# Patient Record
Sex: Male | Born: 1965 | Race: White | Hispanic: No | Marital: Married | Smoking: Never smoker
Health system: Southern US, Community
[De-identification: ages and names within clinical notes are randomized; demographics above are authoritative.]

## PROBLEM LIST (undated history)

## (undated) DIAGNOSIS — M109 Gout, unspecified: Secondary | ICD-10-CM

## (undated) DIAGNOSIS — M1990 Unspecified osteoarthritis, unspecified site: Secondary | ICD-10-CM

## (undated) DIAGNOSIS — R42 Dizziness and giddiness: Secondary | ICD-10-CM

## (undated) HISTORY — PX: OTHER SURGICAL HISTORY: SHX169

## (undated) HISTORY — PX: APPENDECTOMY: SHX54

## (undated) HISTORY — DX: Gout, unspecified: M10.9

---

## 2007-03-29 ENCOUNTER — Ambulatory Visit (HOSPITAL_COMMUNITY): Admission: RE | Admit: 2007-03-29 | Discharge: 2007-03-29 | Payer: Self-pay | Admitting: Nurse Practitioner

## 2010-09-07 ENCOUNTER — Ambulatory Visit: Payer: Self-pay | Admitting: Specialist

## 2010-09-13 ENCOUNTER — Ambulatory Visit: Payer: Self-pay | Admitting: Specialist

## 2010-12-25 ENCOUNTER — Encounter: Payer: Self-pay | Admitting: Orthopaedic Surgery

## 2013-12-17 ENCOUNTER — Encounter: Payer: Self-pay | Admitting: Family Medicine

## 2013-12-17 ENCOUNTER — Ambulatory Visit (INDEPENDENT_AMBULATORY_CARE_PROVIDER_SITE_OTHER): Payer: 59 | Admitting: Family Medicine

## 2013-12-17 VITALS — BP 130/80 | HR 88 | Temp 98.7°F | Resp 18 | Ht 72.0 in | Wt 208.0 lb

## 2013-12-17 DIAGNOSIS — R52 Pain, unspecified: Secondary | ICD-10-CM

## 2013-12-17 DIAGNOSIS — J00 Acute nasopharyngitis [common cold]: Secondary | ICD-10-CM

## 2013-12-17 LAB — INFLUENZA A AND B
INFLUENZA A AG: NEGATIVE
INFLUENZA B AG: NEGATIVE

## 2013-12-17 MED ORDER — GUAIFENESIN-CODEINE 100-10 MG/5ML PO SOLN: 5.0000 mL | Freq: Four times a day (QID) | ORAL | Status: DC | PRN

## 2013-12-17 NOTE — Patient Instructions (Addendum)
Use the cough medication as prescribed Plenty fluids  Mucinex DM during the day  Ibuprofen for fever  F/U as needed

## 2013-12-17 NOTE — Progress Notes (Signed)
   Subjective:    Patient ID: Samuel Fischer, male    DOB: 05/25/1966, 48 y.o.   MRN: [id number]  HPI   patient here with cough with congestion, body aches chills and diaphoresis for the past 36 hours. He's not sure if he's had any high fever. He had been taking some Mucinex as well as ibuprofen. He is concerned about the flu therefore came in for appointment. He does not have any chronic medical problems and is not on any medications to  Review of Systems  GEN- denies fatigue+, fever, weight loss,weakness, recent illness HEENT- denies eye drainage, change in vision, +nasal discharge, CVS- denies chest pain, palpitations RESP- denies SOB,+ cough, wheeze ABD- denies N/V, change in stools, abd pain Neuro- denies headache, dizziness, syncope, seizure activity      Objective:   Physical Exam  GEN- NAD, alert and oriented x3, non toxic appearing HEENT- PERRL, EOMI, non injected sclera, pink conjunctiva, MMM, oropharynx clear, TM clear bilat no effusion, no  maxillary sinus tenderness,+  Nasal drainage  Neck- Supple, no LAD CVS- RRR, no murmur RESP-CTAB EXT- No edema Pulses- Radial 2+         Assessment & Plan:

## 2013-12-17 NOTE — Assessment & Plan Note (Signed)
Influenza is negative. I think this is more of a common cold virus. I've given him some Robitussin with codeine to take for the cough he can use Mucinex DM in the daytime as needed. Supportive care fluids and rest He was given a work note for today

## 2016-06-20 ENCOUNTER — Ambulatory Visit (INDEPENDENT_AMBULATORY_CARE_PROVIDER_SITE_OTHER): Payer: 59 | Admitting: Family Medicine

## 2016-06-20 ENCOUNTER — Encounter: Payer: Self-pay | Admitting: Family Medicine

## 2016-06-20 VITALS — BP 126/80 | Temp 97.8°F | Wt 200.0 lb

## 2016-06-20 DIAGNOSIS — M109 Gout, unspecified: Secondary | ICD-10-CM | POA: Insufficient documentation

## 2016-06-20 DIAGNOSIS — M5432 Sciatica, left side: Secondary | ICD-10-CM

## 2016-06-20 MED ORDER — COLCHICINE 0.6 MG PO TABS: ORAL_TABLET | ORAL | Status: DC

## 2016-06-20 MED ORDER — PREDNISONE 20 MG PO TABS: ORAL_TABLET | ORAL | Status: DC

## 2016-06-20 NOTE — Progress Notes (Signed)
   Subjective:    Patient ID: Samuel Fischer, male    DOB: 17-Nov-1966, 50 y.o.   MRN: [id number]  HPI Patient reports a one-week history of pain in his lower back radiating into his left gluteus down the posterior aspect of his left thigh into his left leg. He believes he is having sciatica. Symptoms began after he aggravated his back lifting weights at work. He apparently was moving a parking barrier so that he parked his truck and felt a pull in his back. The pain had been going on slightly before that but afterwards it intensified. He is here today for further evaluation. He denies any saddle anesthesia, bowel or bladder incontinence, or leg weakness. He does have a positive straight leg raise on the left side. He has normal reflexes and normal strength in both legs No past medical history on file. No past surgical history on file. No current outpatient prescriptions on file prior to visit.   No current facility-administered medications on file prior to visit.   No Known Allergies Social History   Social History  . Marital Status: Single    Spouse Name: N/A  . Number of Children: N/A  . Years of Education: N/A   Occupational History  . Not on file.   Social History Main Topics  . Smoking status: Never Smoker   . Smokeless tobacco: Not on file  . Alcohol Use: Not on file  . Drug Use: Not on file  . Sexual Activity: Not on file   Other Topics Concern  . Not on file   Social History Narrative      Review of Systems  All other systems reviewed and are negative.      Objective:   Physical Exam  Cardiovascular: Normal rate, regular rhythm and normal heart sounds.   Pulmonary/Chest: Effort normal and breath sounds normal. No respiratory distress. He has no wheezes. He has no rales.  Musculoskeletal:       Lumbar back: He exhibits decreased range of motion, tenderness, bony tenderness and pain. He exhibits no spasm.  Vitals reviewed.         Assessment & Plan:  Left  sided sciatica - Plan: predniSONE (DELTASONE) 20 MG tablet  Begin prednisone taper pack for sciatica. Recheck in 2 weeks if no better or sooner if worse.  Also gave the patient 30 tablets of colchicine that he can use as needed for gout exacerbations. He has them very infrequently but would like to have some on hand just in case he needs them

## 2018-12-12 DIAGNOSIS — M722 Plantar fascial fibromatosis: Secondary | ICD-10-CM | POA: Insufficient documentation

## 2019-02-05 ENCOUNTER — Encounter: Payer: Self-pay | Admitting: Podiatry

## 2019-02-05 ENCOUNTER — Ambulatory Visit (INDEPENDENT_AMBULATORY_CARE_PROVIDER_SITE_OTHER): Payer: 59

## 2019-02-05 ENCOUNTER — Ambulatory Visit (INDEPENDENT_AMBULATORY_CARE_PROVIDER_SITE_OTHER): Payer: 59 | Admitting: Podiatry

## 2019-02-05 VITALS — BP 133/89 | HR 70 | Resp 16

## 2019-02-05 DIAGNOSIS — M722 Plantar fascial fibromatosis: Secondary | ICD-10-CM

## 2019-02-05 DIAGNOSIS — M1710 Unilateral primary osteoarthritis, unspecified knee: Secondary | ICD-10-CM | POA: Insufficient documentation

## 2019-02-05 MED ORDER — METHYLPREDNISOLONE 4 MG PO TBPK: ORAL_TABLET | ORAL | 0 refills | Status: DC

## 2019-02-05 MED ORDER — MELOXICAM 15 MG PO TABS: 15.0000 mg | ORAL_TABLET | Freq: Every day | ORAL | 3 refills | Status: DC

## 2019-02-05 NOTE — Patient Instructions (Signed)

## 2019-02-05 NOTE — Progress Notes (Signed)
  Subjective:  Patient ID: Samuel Fischer, male    DOB: February 04, 1966,  MRN: [id number] HPI Chief Complaint  Patient presents with  . Foot Pain    Plantar heel and arch left - aching x 3 months, AM pain and worse at end of day after work, saw doc-gave shot, said to ice, rx'd meloxicam-still hurts  . New Patient (Initial Visit)    53 y.o. male presents with the above complaint.   ROS: Denies fever chills nausea vomiting muscle aches pains calf pain back pain chest pain shortness of breath.  Past Medical History:  Diagnosis Date  . Gout    No past surgical history on file.  Current Outpatient Medications:  .  meloxicam (MOBIC) 15 MG tablet, , Disp: , Rfl:  .  meloxicam (MOBIC) 15 MG tablet, Take 1 tablet (15 mg total) by mouth daily., Disp: 30 tablet, Rfl: 3 .  methylPREDNISolone (MEDROL DOSEPAK) 4 MG TBPK tablet, 6 day dose pack - take as directed, Disp: 21 tablet, Rfl: 0  No Known Allergies Review of Systems Objective:   Vitals:   02/05/19 1453  BP: 133/89  Pulse: 70  Resp: 16    General: Well developed, nourished, in no acute distress, alert and oriented x3   Dermatological: Skin is warm, dry and supple bilateral. Nails x 10 are well maintained; remaining integument appears unremarkable at this time. There are no open sores, no preulcerative lesions, no rash or signs of infection present.  Vascular: Dorsalis Pedis artery and Posterior Tibial artery pedal pulses are 2/4 bilateral with immedate capillary fill time. Pedal hair growth present. No varicosities and no lower extremity edema present bilateral.   Neruologic: Grossly intact via light touch bilateral. Vibratory intact via tuning fork bilateral. Protective threshold with Semmes Wienstein monofilament intact to all pedal sites bilateral. Patellar and Achilles deep tendon reflexes 2+ bilateral. No Babinski or clonus noted bilateral.   Musculoskeletal: No gross boney pedal deformities bilateral. No pain, crepitus, or  limitation noted with foot and ankle range of motion bilateral. Muscular strength 5/5 in all groups tested bilateral.  Pain on palpation medial Cokato tubercle of the left heel.  Gait: Unassisted, Nonantalgic.    Radiographs:  Radiographs taken today demonstrate plantar distally oriented calcaneal heel spur and soft tissue increase in density plantar fashion calcaneal insertion site left heel.  Assessment & Plan:   Assessment: Plantar fasciitis left heel.  Plan: Discussed etiology pathology conservative versus surgical therapies.  At this point I injected his left heel with 20 mg Kenalog 5 mg Marcaine point maximal tenderness left heel.  Tolerated procedure well without complications.  Also put him on a Medrol Dosepak to be followed by meloxicam.  Placed him in a plantar fascial brace.  If he is no better we will consider orthotics and night splint next visit.     Ipek Westra T. Lockhart, [place]

## 2019-03-05 ENCOUNTER — Ambulatory Visit: Payer: 59 | Admitting: Podiatry

## 2019-10-08 ENCOUNTER — Ambulatory Visit (INDEPENDENT_AMBULATORY_CARE_PROVIDER_SITE_OTHER): Payer: 59 | Admitting: Podiatry

## 2019-10-08 ENCOUNTER — Encounter: Payer: Self-pay | Admitting: Podiatry

## 2019-10-08 ENCOUNTER — Other Ambulatory Visit: Payer: Self-pay

## 2019-10-08 ENCOUNTER — Encounter

## 2019-10-08 DIAGNOSIS — M722 Plantar fascial fibromatosis: Secondary | ICD-10-CM | POA: Diagnosis not present

## 2019-10-08 MED ORDER — MELOXICAM 15 MG PO TABS: 15.0000 mg | ORAL_TABLET | Freq: Every day | ORAL | 3 refills | Status: DC

## 2019-10-08 MED ORDER — METHYLPREDNISOLONE 4 MG PO TBPK: ORAL_TABLET | ORAL | 0 refills | Status: DC

## 2019-10-08 NOTE — Progress Notes (Signed)
She presents today for follow-up of her plantar fasciitis and orthotics.  She states that it started hurting again about 2 weeks ago  Objective: Vital signs are stable alert oriented x3.  She has pain to palpation medial calcaneal tubercle of the left heel.  Assessment: Plantar fasciitis left.  Plan: Injected left heel today 20 mg Kenalog 5 mg Marcaine follow-up with me as needed.

## 2019-11-05 ENCOUNTER — Ambulatory Visit: Payer: 59 | Admitting: Orthotics

## 2019-11-05 ENCOUNTER — Other Ambulatory Visit: Payer: Self-pay

## 2019-11-05 DIAGNOSIS — M722 Plantar fascial fibromatosis: Secondary | ICD-10-CM

## 2019-11-05 NOTE — Progress Notes (Signed)
Patient came in today to pick up custom made foot orthotics.  The goals were accomplished and the patient reported no dissatisfaction with said orthotics.  Patient was advised of breakin period and how to report any issues. 

## 2020-08-31 ENCOUNTER — Ambulatory Visit (INDEPENDENT_AMBULATORY_CARE_PROVIDER_SITE_OTHER): Payer: 59 | Admitting: Podiatry

## 2020-08-31 ENCOUNTER — Ambulatory Visit (INDEPENDENT_AMBULATORY_CARE_PROVIDER_SITE_OTHER): Payer: 59

## 2020-08-31 ENCOUNTER — Encounter: Payer: Self-pay | Admitting: Podiatry

## 2020-08-31 ENCOUNTER — Other Ambulatory Visit: Payer: Self-pay

## 2020-08-31 DIAGNOSIS — M10072 Idiopathic gout, left ankle and foot: Secondary | ICD-10-CM | POA: Diagnosis not present

## 2020-08-31 DIAGNOSIS — M7752 Other enthesopathy of left foot: Secondary | ICD-10-CM

## 2020-08-31 MED ORDER — COLCHICINE 0.6 MG PO TABS: 0.6000 mg | ORAL_TABLET | Freq: Two times a day (BID) | ORAL | 1 refills | Status: DC

## 2020-08-31 MED ORDER — ALLOPURINOL 100 MG PO TABS: 100.0000 mg | ORAL_TABLET | Freq: Every day | ORAL | 3 refills | Status: DC

## 2020-09-01 ENCOUNTER — Encounter: Payer: Self-pay | Admitting: Podiatry

## 2020-09-01 NOTE — Progress Notes (Signed)
Subjective:  Patient ID: Samuel Fischer, male    DOB: 09-22-1966,  MRN: [id number]  Chief Complaint  Patient presents with  . Foot Pain    Patient presents today for possible gout flareup right hallux and injury to same toe about 1 week go.  He states "i dropped a bushhog on my foot about 1 week ago, I had steel toed boots on, but the same time I think my gout was flaring up"    54 y.o. male presents with the above complaint.  Patient presents with a possible gout flareup to the left hallux.  Patient states this started at the same time when he had an injury where ago Chaga was dropped on the foot.  Patient does not know if his injury or the gout flare.  Patient does have a history of gout and has been treated in the past for it.  Patient does have some allopurinol and ice which has not helped.  He would like to discuss treatment options.  His diet is significant for red meat and alcohol consumption.  I encouraged decreasing that to help with decreasing the total purine count.  Patient states understanding  Review of Systems: Negative except as noted in the HPI. Denies N/V/F/Ch.  Past Medical History:  Diagnosis Date  . Gout     Current Outpatient Medications:  .  allopurinol (ZYLOPRIM) 100 MG tablet, Take 1 tablet (100 mg total) by mouth daily., Disp: 30 tablet, Rfl: 3 .  colchicine 0.6 MG tablet, Take 1 tablet (0.6 mg total) by mouth 2 (two) times daily., Disp: 30 tablet, Rfl: 1  Social History   Tobacco Use  Smoking Status Never Smoker  Smokeless Tobacco Never Used    No Known Allergies Objective:  There were no vitals filed for this visit. There is no height or weight on file to calculate BMI. Constitutional Well developed. Well nourished.  Vascular Dorsalis pedis pulses palpable bilaterally. Posterior tibial pulses palpable bilaterally. Capillary refill normal to all digits.  No cyanosis or clubbing noted. Pedal hair growth normal.  Neurologic Normal speech. Oriented to  person, place, and time. Epicritic sensation to light touch grossly present bilaterally.  Dermatologic Nails well groomed and normal in appearance. No open wounds. No skin lesions.  Orthopedic:  Pain on palpation to the left first MPJ.  Pain with range of motion of the first MPJ.  Mild crepitus noted.  No intra-articular pain.  Red hot swollen joint noted.   Radiographs: 3 views of skeletally mature the left foot: Mild extra-articular changes noted consistent with gout.  No signs of arthritic changes or intra-articular changes noted.  No other bony abnormalities noted. Assessment:   1. Acute idiopathic gout involving toe of left foot   2. Capsulitis of metatarsophalangeal (MTP) joint of left foot    Plan:  Patient was evaluated and treated and all questions answered.  Left first MPJ gout flare -I explained to the patient the etiology of gout flare and various treatment options were discussed.  Given the patient has a poor diet control in terms of red meat and alcohol consumption I encouraged better diet control.  Patient states understanding and he will work on that.  At this time given the amount of pain that is having I believe will benefit from a steroid injection.  Patient agrees with the plan would like to proceed with a steroid injection. -A steroid injection was performed at left first MPJ joint using 1% plain Lidocaine and 10 mg of Kenalog.  This was well tolerated. -I encouraged the patient to follow-up with a rheumatologist for further management and evaluation.  I will place him on allopurinol and dispensed colchicine to help with acute pain at this time.  No follow-ups on file.

## 2020-10-19 ENCOUNTER — Telehealth: Payer: Self-pay | Admitting: *Deleted

## 2020-10-19 NOTE — Telephone Encounter (Signed)
"  Dr. Posey Pronto gave me a prescription for Allupurinol when I was last there.  I need a refill.  I have refills but my insurance only allows me to get 90 day supplies.  Is it possible to have that prescription changed to a 90 supply?"  I'll send your message to the nurse and she'll call you back.

## 2020-10-20 NOTE — Telephone Encounter (Signed)
"  I called yesterday and requested a 90 day prescription for Allupurinol.  I never received a call back."  Dr. Posey Pronto said if you need a Filippini term prescription for the Allupurinol, you must ask your Rheumatologist.  "Okay, I'll do that.  It will probably take a month to get an appointment with the Rheumatologist.  Can he call me in a prescription for another refill to last me until I see my doctor?"  You have 3 refills on your prescription.  Call your pharmacy and let them know you need a refill.  "Okay, I don't know how all this stuff works.  Thanks for your help."

## 2020-10-20 NOTE — Telephone Encounter (Signed)
If the insurance only allows 90-day at a time that unfortunately she would just have to go in every 90 days.  She can also see a rheumatologist who can prescribe her for more Potenza-term management.

## 2021-04-11 ENCOUNTER — Other Ambulatory Visit: Payer: Self-pay | Admitting: Podiatry

## 2021-04-11 NOTE — Telephone Encounter (Signed)
Please Advise

## 2021-04-20 ENCOUNTER — Other Ambulatory Visit: Payer: Self-pay

## 2021-04-20 ENCOUNTER — Ambulatory Visit (INDEPENDENT_AMBULATORY_CARE_PROVIDER_SITE_OTHER): Payer: 59 | Admitting: Internal Medicine

## 2021-04-20 ENCOUNTER — Encounter: Payer: Self-pay | Admitting: Internal Medicine

## 2021-04-20 VITALS — BP 121/71 | HR 64 | Temp 98.7°F | Resp 18 | Ht 72.0 in | Wt 209.0 lb

## 2021-04-20 DIAGNOSIS — Z7689 Persons encountering health services in other specified circumstances: Secondary | ICD-10-CM | POA: Diagnosis not present

## 2021-04-20 DIAGNOSIS — Z114 Encounter for screening for human immunodeficiency virus [HIV]: Secondary | ICD-10-CM | POA: Diagnosis not present

## 2021-04-20 DIAGNOSIS — Z1159 Encounter for screening for other viral diseases: Secondary | ICD-10-CM

## 2021-04-20 DIAGNOSIS — M1A9XX0 Chronic gout, unspecified, without tophus (tophi): Secondary | ICD-10-CM | POA: Diagnosis not present

## 2021-04-20 DIAGNOSIS — Z5320 Procedure and treatment not carried out because of patient's decision for unspecified reasons: Secondary | ICD-10-CM

## 2021-04-20 MED ORDER — ALLOPURINOL 100 MG PO TABS: 100.0000 mg | ORAL_TABLET | Freq: Every day | ORAL | 5 refills | Status: DC

## 2021-04-20 NOTE — Progress Notes (Signed)
New Patient Office Visit  Subjective:  Patient ID: Samuel Fischer, male    DOB: 06/08/1966  Age: 55 y.o. MRN: [id number]  CC:  Chief Complaint  Patient presents with  . New Patient (Initial Visit)    New patient has gout flare ups in feet all the time needs medications for this.    HPI Samuel Fischer is a 55 year old male with PMH of gout who presents for establishing care.  He has been doing well overall. He was on Allopurinol for gout in the past, but he ran out of it due to lack of follow up with PCP. He has had about 3 gout flare-ups in last 1 year. He denies any foot pain currently. He also used to follow up with Podiatrist for plantar fasciitis.  He has not had any colonoscopy yet.  He has had 2 doses of COVID vaccine.  Past Medical History:  Diagnosis Date  . Gout     History reviewed. No pertinent surgical history.  History reviewed. No pertinent family history.  Social History   Socioeconomic History  . Marital status: Single    Spouse name: Not on file  . Number of children: Not on file  . Years of education: Not on file  . Highest education level: Not on file  Occupational History  . Not on file  Tobacco Use  . Smoking status: Never Smoker  . Smokeless tobacco: Never Used  Substance and Sexual Activity  . Alcohol use: Not on file  . Drug use: Not on file  . Sexual activity: Not on file  Other Topics Concern  . Not on file  Social History Narrative  . Not on file   Social Determinants of Health   Financial Resource Strain: Not on file  Food Insecurity: Not on file  Transportation Needs: Not on file  Physical Activity: Not on file  Stress: Not on file  Social Connections: Not on file  Intimate Partner Violence: Not on file    ROS Review of Systems  Constitutional: Negative for chills and fever.  HENT: Negative for congestion and sore throat.   Eyes: Negative for pain and discharge.  Respiratory: Negative for cough and shortness of breath.    Cardiovascular: Negative for chest pain and palpitations.  Gastrointestinal: Negative for constipation, diarrhea, nausea and vomiting.  Endocrine: Negative for polydipsia and polyuria.  Genitourinary: Negative for dysuria and hematuria.  Musculoskeletal: Negative for neck pain and neck stiffness.  Skin: Negative for rash.  Neurological: Negative for dizziness, weakness, numbness and headaches.  Psychiatric/Behavioral: Negative for agitation and behavioral problems.    Objective:   Today's Vitals: BP 121/71 (BP Location: Left Arm, Patient Position: Sitting, Cuff Size: Normal)   Pulse 64   Temp 98.7 F (37.1 C) (Oral)   Resp 18   Ht 6' (1.829 m)   Wt 209 lb (94.8 kg)   SpO2 97%   BMI 28.35 kg/m   Physical Exam Vitals reviewed.  Constitutional:      General: He is not in acute distress.    Appearance: He is not diaphoretic.  HENT:     Head: Normocephalic and atraumatic.     Nose: Nose normal.     Mouth/Throat:     Mouth: Mucous membranes are moist.  Eyes:     General: No scleral icterus.    Extraocular Movements: Extraocular movements intact.  Cardiovascular:     Rate and Rhythm: Normal rate and regular rhythm.     Pulses: Normal pulses.  Heart sounds: Normal heart sounds. No murmur heard.   Pulmonary:     Breath sounds: Normal breath sounds. No wheezing or rales.  Musculoskeletal:     Cervical back: Neck supple. No tenderness.     Right lower leg: No edema.     Left lower leg: No edema.  Skin:    General: Skin is warm.     Findings: No rash.  Neurological:     General: No focal deficit present.     Mental Status: He is alert and oriented to person, place, and time.  Psychiatric:        Mood and Affect: Mood normal.        Behavior: Behavior normal.     Assessment & Plan:   Problem List Items Addressed This Visit      Other   Gout    Chronic gout Was on Allopurinol, ran out of it Had about 3 gout flares last year Refilled Allopurinol Check uric  acid level      Relevant Medications   allopurinol (ZYLOPRIM) 100 MG tablet   Other Relevant Orders   Uric acid   Encounter to establish care - Primary    Care established Previous chart reviewed History and medications reviewed with the patient      Relevant Orders   CBC with Differential/Platelet   CMP14+EGFR   Lipid panel   HgB A1c   Colonoscopy refused    Reports colon cancer in family (Uncle) in his 9s Denies colonoscopy for now Emphasized importance in his case, he states he would think about it.       Other Visit Diagnoses    Need for hepatitis C screening test       Relevant Orders   Hepatitis C Antibody   Encounter for screening for HIV       Relevant Orders   HIV antibody (with reflex)      Outpatient Encounter Medications as of 04/20/2021  Medication Sig  . allopurinol (ZYLOPRIM) 100 MG tablet Take 1 tablet (100 mg total) by mouth daily.  . colchicine 0.6 MG tablet Take 1 tablet by mouth twice daily (Patient not taking: Reported on 04/20/2021)  . [DISCONTINUED] allopurinol (ZYLOPRIM) 100 MG tablet Take 1 tablet (100 mg total) by mouth daily. (Patient not taking: Reported on 04/20/2021)   No facility-administered encounter medications on file as of 04/20/2021.    Follow-up: Return in about 6 months (around 10/21/2021) for Annual physical.   Samuel Spar, MD

## 2021-04-20 NOTE — Assessment & Plan Note (Signed)
Reports colon cancer in family (Uncle) in his 63s Denies colonoscopy for now Emphasized importance in his case, he states he would think about it.

## 2021-04-20 NOTE — Patient Instructions (Signed)
Please continue taking Allopurinol for gout prophylaxis.  Please avoid alcohol and high-protein food.  Please get fasting blood tests done before the next visit.  Please bring the blood test reports from your workplace if possible.

## 2021-04-20 NOTE — Assessment & Plan Note (Addendum)
Chronic gout Was on Allopurinol, ran out of it Had about 3 gout flares last year Refilled Allopurinol Check uric acid level

## 2021-04-20 NOTE — Assessment & Plan Note (Signed)
Care established Previous chart reviewed History and medications reviewed with the patient 

## 2021-10-20 ENCOUNTER — Other Ambulatory Visit: Payer: Self-pay

## 2021-10-20 ENCOUNTER — Ambulatory Visit (INDEPENDENT_AMBULATORY_CARE_PROVIDER_SITE_OTHER): Payer: 59 | Admitting: Internal Medicine

## 2021-10-20 ENCOUNTER — Encounter: Payer: Self-pay | Admitting: Internal Medicine

## 2021-10-20 VITALS — BP 138/78 | HR 71 | Resp 18 | Ht 72.0 in | Wt 213.1 lb

## 2021-10-20 DIAGNOSIS — M1710 Unilateral primary osteoarthritis, unspecified knee: Secondary | ICD-10-CM

## 2021-10-20 DIAGNOSIS — Z1211 Encounter for screening for malignant neoplasm of colon: Secondary | ICD-10-CM

## 2021-10-20 DIAGNOSIS — Z0001 Encounter for general adult medical examination with abnormal findings: Secondary | ICD-10-CM | POA: Insufficient documentation

## 2021-10-20 DIAGNOSIS — M1A9XX0 Chronic gout, unspecified, without tophus (tophi): Secondary | ICD-10-CM

## 2021-10-20 MED ORDER — ALLOPURINOL 100 MG PO TABS: 100.0000 mg | ORAL_TABLET | Freq: Every day | ORAL | 1 refills | Status: DC

## 2021-10-20 NOTE — Patient Instructions (Addendum)
Please continue taking medications as prescribed.  Please take Tylenol arthritis for knee pain. Okay to use Voltaren gel for knee arthritis.

## 2021-10-20 NOTE — Assessment & Plan Note (Signed)
Reports colon cancer in family (Uncle) in his 59s Emphasized importance in his case, he agrees.

## 2021-10-20 NOTE — Assessment & Plan Note (Signed)
Right knee pain with swelling Has had arthroscopy in the past Could be ligament injury in addition to arthritis, offered Orthopedic surgery referral, he prefers to wait for now. Tylenol arthritis PRN Voltaren gel PRN

## 2021-10-20 NOTE — Assessment & Plan Note (Signed)
Chronic gout Had about 3 gout flares last year Refilled Allopurinol Check uric acid level 

## 2021-10-20 NOTE — Progress Notes (Signed)
Established Patient Office Visit  Subjective:  Patient ID: Samuel Fischer, male    DOB: 04-03-1966  Age: 55 y.o. MRN: [id number]  CC:  Chief Complaint  Patient presents with   Annual Exam    Annual exam     HPI DVID PENDRY is a 55 y.o. male with past medical history of gout who presents for annual physical.  He has been doing well overall. No episodes of gout since starting Allopurinol.  He c/o chronic right knee pain. He has had arthroscopy in the past, but does not have any Orthopedic surgeon to follow up currently. He has been taking BC powder for pain. 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  He agrees to get screening colonoscopy done.  He denies flu and shingrix vaccine.  Past Medical History:  Diagnosis Date   Gout     History reviewed. No pertinent surgical history.  History reviewed. No pertinent family history.  Social History   Socioeconomic History   Marital status: Single    Spouse name: Not on file   Number of children: Not on file   Years of education: Not on file   Highest education level: Not on file  Occupational History   Not on file  Tobacco Use   Smoking status: Never   Smokeless tobacco: Never  Substance and Sexual Activity   Alcohol use: Not on file   Drug use: Not on file   Sexual activity: Not on file  Other Topics Concern   Not on file  Social History Narrative   Not on file   Social Determinants of Health   Financial Resource Strain: Not on file  Food Insecurity: Not on file  Transportation Needs: Not on file  Physical Activity: Not on file  Stress: Not on file  Social Connections: Not on file  Intimate Partner Violence: Not on file    Outpatient Medications Prior to Visit  Medication Sig Dispense Refill   allopurinol (ZYLOPRIM) 100 MG tablet Take 1 tablet (100 mg total) by mouth daily. 30 tablet 5   colchicine 0.6 MG tablet Take 1 tablet by mouth twice daily (Patient not taking: Reported on 04/20/2021) 30  tablet 0   No facility-administered medications prior to visit.    No Known Allergies  ROS Review of Systems  Constitutional:  Negative for chills and fever.  HENT:  Negative for congestion and sore throat.   Eyes:  Negative for pain and discharge.  Respiratory:  Negative for cough and shortness of breath.   Cardiovascular:  Negative for chest pain and palpitations.  Gastrointestinal:  Negative for constipation, diarrhea, nausea and vomiting.  Endocrine: Negative for polydipsia and polyuria.  Genitourinary:  Negative for dysuria and hematuria.  Musculoskeletal:  Positive for arthralgias (R knee). Negative for neck pain and neck stiffness.  Skin:  Negative for rash.  Neurological:  Negative for dizziness, weakness, numbness and headaches.  Psychiatric/Behavioral:  Negative for agitation and behavioral problems.      Objective:    Physical Exam Vitals reviewed.  Constitutional:      General: He is not in acute distress.    Appearance: He is not diaphoretic.  HENT:     Head: Normocephalic and atraumatic.     Nose: Nose normal.     Mouth/Throat:     Mouth: Mucous membranes are moist.  Eyes:     General: No scleral icterus.    Extraocular Movements: Extraocular movements intact.  Cardiovascular:     Rate  and Rhythm: Normal rate and regular rhythm.     Pulses: Normal pulses.     Heart sounds: Normal heart sounds. No murmur heard. Pulmonary:     Breath sounds: Normal breath sounds. No wheezing or rales.  Abdominal:     Palpations: Abdomen is soft.     Tenderness: There is no abdominal tenderness.  Musculoskeletal:        General: Tenderness (Medial aspect of right knee with mild swelling, no erythema) present.     Cervical back: Neck supple. No tenderness.     Right lower leg: No edema.     Left lower leg: No edema.  Skin:    General: Skin is warm.     Findings: No rash.  Neurological:     General: No focal deficit present.     Mental Status: He is alert and oriented  to person, place, and time.     Cranial Nerves: No cranial nerve deficit.     Sensory: No sensory deficit.     Motor: No weakness.  Psychiatric:        Mood and Affect: Mood normal.        Behavior: Behavior normal.    BP 138/78 (BP Location: Left Arm, Patient Position: Sitting, Cuff Size: Normal)   Pulse 71   Resp 18   Ht 6' (1.829 m)   Wt 213 lb 1.3 oz (96.7 kg)   SpO2 98%   BMI 28.90 kg/m  Wt Readings from Last 3 Encounters:  10/20/21 213 lb 1.3 oz (96.7 kg)  04/20/21 209 lb (94.8 kg)  06/20/16 200 lb (90.7 kg)    No results found for: TSH No results found for: WBC, HGB, HCT, MCV, PLT No results found for: NA, K, CHLORIDE, CO2, GLUCOSE, BUN, CREATININE, BILITOT, ALKPHOS, AST, ALT, PROT, ALBUMIN, CALCIUM, ANIONGAP, EGFR, GFR No results found for: CHOL No results found for: HDL No results found for: LDLCALC No results found for: TRIG No results found for: CHOLHDL No results found for: HGBA1C    Assessment & Plan:   Problem List Items Addressed This Visit       Encounter for general adult medical examination with abnormal findings - Primary   Physical exam as documented. Counseling done  re healthy lifestyle involving commitment to 150 minutes exercise per week, heart healthy diet, and attaining healthy weight.The importance of adequate sleep also discussed. Changes in health habits are decided on by the patient with goals and time frames  set for achieving them. Immunization and cancer screening needs are specifically addressed at this visit.  Denies flu and Shingrix vaccine.       Musculoskeletal and Integument   Arthritis of knee    Right knee pain with swelling Has had arthroscopy in the past Could be ligament injury in addition to arthritis, offered Orthopedic surgery referral, he prefers to wait for now. Tylenol arthritis PRN Voltaren gel PRN      Relevant Medications   allopurinol (ZYLOPRIM) 100 MG tablet     Other   Gout    Chronic gout Had  about 3 gout flares last year Refilled Allopurinol Check uric acid level      Relevant Medications   allopurinol (ZYLOPRIM) 100 MG tablet         Screening for colon cancer    Reports colon cancer in family (Uncle) in his 14s Emphasized importance in his case, he agrees.      Relevant Orders   Ambulatory referral to Gastroenterology  Meds ordered this encounter  Medications   allopurinol (ZYLOPRIM) 100 MG tablet    Sig: Take 1 tablet (100 mg total) by mouth daily.    Dispense:  90 tablet    Refill:  1    Follow-up: Return in 6 months (on 04/19/2022).    Lindell Spar, MD

## 2021-10-20 NOTE — Assessment & Plan Note (Signed)

## 2021-10-21 LAB — LIPID PANEL
Chol/HDL Ratio: 4.1 ratio (ref 0.0–5.0)
Cholesterol, Total: 195 mg/dL (ref 100–199)
HDL: 47 mg/dL (ref >39)
LDL Chol Calc (NIH): 108 mg/dL — ABNORMAL HIGH (ref 0–99)
Triglycerides: 233 mg/dL — ABNORMAL HIGH (ref 0–149)
VLDL Cholesterol Cal: 40 mg/dL (ref 5–40)

## 2021-10-21 LAB — CMP14+EGFR
ALT: 26 IU/L (ref 0–44)
AST: 24 IU/L (ref 0–40)
Albumin/Globulin Ratio: 1.7 (ref 1.2–2.2)
Albumin: 4.3 g/dL (ref 3.8–4.9)
Alkaline Phosphatase: 99 IU/L (ref 44–121)
BUN/Creatinine Ratio: 14 (ref 9–20)
BUN: 15 mg/dL (ref 6–24)
Bilirubin Total: 0.3 mg/dL (ref 0.0–1.2)
CO2: 24 mmol/L (ref 20–29)
Calcium: 9.6 mg/dL (ref 8.7–10.2)
Chloride: 103 mmol/L (ref 96–106)
Creatinine, Ser: 1.07 mg/dL (ref 0.76–1.27)
Globulin, Total: 2.5 g/dL (ref 1.5–4.5)
Glucose: 100 mg/dL — ABNORMAL HIGH (ref 70–99)
Potassium: 4.8 mmol/L (ref 3.5–5.2)
Sodium: 142 mmol/L (ref 134–144)
Total Protein: 6.8 g/dL (ref 6.0–8.5)
eGFR: 83 mL/min/{1.73_m2} (ref >59)

## 2021-10-21 LAB — CBC WITH DIFFERENTIAL/PLATELET
Basophils Absolute: 0.1 10*3/uL (ref 0.0–0.2)
Basos: 1 %
EOS (ABSOLUTE): 0.2 10*3/uL (ref 0.0–0.4)
Eos: 3 %
Hematocrit: 41.6 % (ref 37.5–51.0)
Hemoglobin: 14.5 g/dL (ref 13.0–17.7)
Immature Grans (Abs): 0 10*3/uL (ref 0.0–0.1)
Immature Granulocytes: 0 %
Lymphocytes Absolute: 1.8 10*3/uL (ref 0.7–3.1)
Lymphs: 28 %
MCH: 31.7 pg (ref 26.6–33.0)
MCHC: 34.9 g/dL (ref 31.5–35.7)
MCV: 91 fL (ref 79–97)
Monocytes Absolute: 0.6 10*3/uL (ref 0.1–0.9)
Monocytes: 10 %
Neutrophils Absolute: 3.6 10*3/uL (ref 1.4–7.0)
Neutrophils: 58 %
Platelets: 246 10*3/uL (ref 150–450)
RBC: 4.58 x10E6/uL (ref 4.14–5.80)
RDW: 12.5 % (ref 11.6–15.4)
WBC: 6.3 10*3/uL (ref 3.4–10.8)

## 2021-10-21 LAB — HEMOGLOBIN A1C
Est. average glucose Bld gHb Est-mCnc: 105 mg/dL
Hgb A1c MFr Bld: 5.3 % (ref 4.8–5.6)

## 2021-10-21 LAB — HIV ANTIBODY (ROUTINE TESTING W REFLEX): HIV Screen 4th Generation wRfx: NONREACTIVE

## 2021-10-21 LAB — URIC ACID: Uric Acid: 6.6 mg/dL (ref 3.8–8.4)

## 2021-10-21 LAB — HEPATITIS C ANTIBODY: Hep C Virus Ab: 0.2 s/co ratio (ref 0.0–0.9)

## 2021-10-24 ENCOUNTER — Encounter (INDEPENDENT_AMBULATORY_CARE_PROVIDER_SITE_OTHER): Payer: Self-pay | Admitting: *Deleted

## 2021-11-09 ENCOUNTER — Other Ambulatory Visit (INDEPENDENT_AMBULATORY_CARE_PROVIDER_SITE_OTHER): Payer: Self-pay

## 2021-11-09 DIAGNOSIS — Z1211 Encounter for screening for malignant neoplasm of colon: Secondary | ICD-10-CM

## 2021-11-14 ENCOUNTER — Telehealth (INDEPENDENT_AMBULATORY_CARE_PROVIDER_SITE_OTHER): Payer: Self-pay

## 2021-11-14 ENCOUNTER — Encounter (INDEPENDENT_AMBULATORY_CARE_PROVIDER_SITE_OTHER): Payer: Self-pay

## 2021-11-14 ENCOUNTER — Other Ambulatory Visit (INDEPENDENT_AMBULATORY_CARE_PROVIDER_SITE_OTHER): Payer: Self-pay

## 2021-11-14 MED ORDER — PEG 3350-KCL-NA BICARB-NACL 420 G PO SOLR: 4000.0000 mL | ORAL | 0 refills | Status: DC

## 2021-11-14 NOTE — Telephone Encounter (Signed)
Referring MD/PCP: Posey Pronto  Procedure: Tcs  Reason/Indication:  Screening , fam hx of colon ca  Has patient had this procedure before?  no  If so, when, by whom and where?    Is there a family history of colon cancer?  Yes   Who?  What age when diagnosed?  Uncle  Is patient diabetic? If yes, Type 1 or Type 2   no      Does patient have prosthetic heart valve or mechanical valve?  no  Do you have a pacemaker/defibrillator?  no  Has patient ever had endocarditis/atrial fibrillation? no  Does patient use oxygen? no  Has patient had joint replacement within last 12 months?  no  Is patient constipated or do they take laxatives? no  Does patient have a history of alcohol/drug use?  no  Have you had a stroke/heart attack last 6 mths? no  Do you take medicine for weight loss?  no  For male patients,: do you still have your menstrual cycle? no  Is patient on blood thinner such as Coumadin, Plavix and/or Aspirin? no  Medications: allopurinol 100 mg daily  Allergies: nkda  Medication Adjustment per Dr Jenetta Downer none  Procedure date & time: Wednesday 12/14/21 at 9:00

## 2021-11-14 NOTE — Telephone Encounter (Signed)
LeighAnn Roberto Hlavaty, CMA  

## 2021-11-21 ENCOUNTER — Encounter (HOSPITAL_COMMUNITY): Payer: Self-pay | Admitting: *Deleted

## 2021-11-21 ENCOUNTER — Emergency Department (HOSPITAL_COMMUNITY)
Admission: EM | Admit: 2021-11-21 | Discharge: 2021-11-21 | Disposition: A | Discharge status: Home / Self Care | Payer: 59 | Attending: Emergency Medicine | Admitting: Emergency Medicine

## 2021-11-21 ENCOUNTER — Emergency Department (HOSPITAL_COMMUNITY): Payer: 59

## 2021-11-21 ENCOUNTER — Other Ambulatory Visit: Payer: Self-pay

## 2021-11-21 DIAGNOSIS — M542 Cervicalgia: Secondary | ICD-10-CM | POA: Insufficient documentation

## 2021-11-21 DIAGNOSIS — H81399 Other peripheral vertigo, unspecified ear: Secondary | ICD-10-CM

## 2021-11-21 DIAGNOSIS — R112 Nausea with vomiting, unspecified: Secondary | ICD-10-CM | POA: Insufficient documentation

## 2021-11-21 DIAGNOSIS — R911 Solitary pulmonary nodule: Secondary | ICD-10-CM | POA: Diagnosis not present

## 2021-11-21 DIAGNOSIS — R42 Dizziness and giddiness: Secondary | ICD-10-CM | POA: Diagnosis not present

## 2021-11-21 HISTORY — DX: Dizziness and giddiness: R42

## 2021-11-21 HISTORY — DX: Unspecified osteoarthritis, unspecified site: M19.90

## 2021-11-21 LAB — TROPONIN I (HIGH SENSITIVITY)
Troponin I (High Sensitivity): 3 ng/L (ref <18)
Troponin I (High Sensitivity): 4 ng/L (ref <18)

## 2021-11-21 LAB — CBC
HCT: 42.5 % (ref 39.0–52.0)
Hemoglobin: 14.4 g/dL (ref 13.0–17.0)
MCH: 31.7 pg (ref 26.0–34.0)
MCHC: 33.9 g/dL (ref 30.0–36.0)
MCV: 93.6 fL (ref 80.0–100.0)
Platelets: 220 10*3/uL (ref 150–400)
RBC: 4.54 MIL/uL (ref 4.22–5.81)
RDW: 12.3 % (ref 11.5–15.5)
WBC: 7.1 10*3/uL (ref 4.0–10.5)
nRBC: 0 % (ref 0.0–0.2)

## 2021-11-21 LAB — BASIC METABOLIC PANEL
Anion gap: 10 (ref 5–15)
BUN: 16 mg/dL (ref 6–20)
CO2: 24 mmol/L (ref 22–32)
Calcium: 8.7 mg/dL — ABNORMAL LOW (ref 8.9–10.3)
Chloride: 98 mmol/L (ref 98–111)
Creatinine, Ser: 0.85 mg/dL (ref 0.61–1.24)
GFR, Estimated: >60 mL/min (ref >60)
Glucose, Bld: 136 mg/dL — ABNORMAL HIGH (ref 70–99)
Potassium: 4.1 mmol/L (ref 3.5–5.1)
Sodium: 132 mmol/L — ABNORMAL LOW (ref 135–145)

## 2021-11-21 MED ORDER — IOHEXOL 350 MG/ML SOLN
75.0000 mL | Freq: Once | INTRAVENOUS | Status: AC | PRN
  Administered 2021-11-21: 10:00:00 75 mL via INTRAVENOUS

## 2021-11-21 MED ORDER — MECLIZINE HCL 12.5 MG PO TABS
50.0000 mg | ORAL_TABLET | Freq: Once | ORAL | Status: AC
  Administered 2021-11-21: 08:00:00 50 mg via ORAL
  Filled 2021-11-21: qty 4

## 2021-11-21 MED ORDER — IOHEXOL 350 MG/ML SOLN: 100.0000 mL | Freq: Once | INTRAVENOUS | Status: DC | PRN

## 2021-11-21 MED ORDER — DIAZEPAM 5 MG PO TABS
5.0000 mg | ORAL_TABLET | Freq: Once | ORAL | Status: AC
  Administered 2021-11-21: 08:00:00 5 mg via ORAL
  Filled 2021-11-21: qty 1

## 2021-11-21 MED ORDER — ONDANSETRON HCL 4 MG/2ML IJ SOLN
4.0000 mg | Freq: Once | INTRAMUSCULAR | Status: AC
  Administered 2021-11-21: 10:00:00 4 mg via INTRAVENOUS
  Filled 2021-11-21: qty 2

## 2021-11-21 MED ORDER — ONDANSETRON 4 MG PO TBDP: 4.0000 mg | ORAL_TABLET | Freq: Three times a day (TID) | ORAL | 0 refills | Status: DC | PRN

## 2021-11-21 MED ORDER — MECLIZINE HCL 25 MG PO TABS: 25.0000 mg | ORAL_TABLET | Freq: Three times a day (TID) | ORAL | 0 refills | Status: AC | PRN

## 2021-11-21 MED ORDER — DIAZEPAM 2 MG PO TABS: 2.0000 mg | ORAL_TABLET | Freq: Two times a day (BID) | ORAL | 0 refills | Status: AC | PRN

## 2021-11-21 NOTE — ED Provider Notes (Signed)
[facility] Provider Note   CSN: [id number] Arrival date & time: 11/21/21  5449     History CC: Vertigo   Samuel Fischer is a 55 y.o. male with history of vertigo presented to ED with acute onset of dizziness and vertigo while driving to work today.  The patient reports he woke up feeling fine this morning.  He said the symptoms began around 0500 while driving to work with severe, sudden vertigo.  The symptoms are worse when he moves or opens his eyes.  He also feels nauseated and vomited a few times.  He reports he has had similar episodes in the past, most recently 4 months ago.  They tend to go away on their own.  He is not on medication for vertigo.  His wife reports the patient had a fall about 3 days ago and felt something "pop in the back of his neck" and was complaining of some soreness in his neck.  He did not have any vertigo at the time.    Patient reports that he was using a Q-tip to clean his ears and noted some blood from the right ear last night.  No hearing loss.  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  He denies any headache at this time.  He denies numbness or weakness.  Denies history of smoking, diabetes, high cholesterol, TIA or stroke.  Denies history of arrhythmia or heart attack.  He denies any chest pain or pressure.         Past Medical History:  Diagnosis Date   Arthritis    Gout    Vertigo     Patient Active Problem List   Diagnosis Date Noted   Encounter for general adult medical examination with abnormal findings 10/20/2021   Screening for colon cancer 10/20/2021   Colonoscopy refused 04/20/2021   Arthritis of knee 02/05/2019   Gout     Past Surgical History:  Procedure Laterality Date   APPENDECTOMY         No family history on file.  Social History   Tobacco Use   Smoking status: Never   Smokeless tobacco: Never  Vaping Use   Vaping Use: Never used  Substance Use Topics   Alcohol use: Yes    Comment:  occasionally   Drug use: Never    Home Medications Prior to Admission medications   Medication Sig Start Date End Date Taking? Authorizing Provider  allopurinol (ZYLOPRIM) 100 MG tablet Take 1 tablet (100 mg total) by mouth daily. 10/20/21  Yes Lindell Spar, MD  cetirizine (ZYRTEC) 10 MG tablet Take 10 mg by mouth daily.   Yes [provider]  diazepam (VALIUM) 2 MG tablet Take 1 tablet (2 mg total) by mouth every 12 (twelve) hours as needed for up to 10 days (Vertigo). 11/21/21 12/01/21 Yes Matilde Markie, Carola Rhine, MD  meclizine (ANTIVERT) 25 MG tablet Take 1 tablet (25 mg total) by mouth 3 (three) times daily as needed for dizziness. 11/21/21 12/21/21 Yes Celenia Hruska, Carola Rhine, MD  ondansetron (ZOFRAN-ODT) 4 MG disintegrating tablet Take 1 tablet (4 mg total) by mouth every 8 (eight) hours as needed for up to 15 doses for nausea or vomiting. 11/21/21  Yes Hercules Hasler, Carola Rhine, MD  polyethylene glycol-electrolytes (TRILYTE) 420 g solution Take 4,000 mLs by mouth as directed. Patient not taking: Reported on 11/21/2021 11/14/21   Harvel Quale, MD    Allergies    Patient has no known allergies.  Review of  Systems   Review of Systems  Constitutional:  Negative for chills and fever.  HENT:  Negative for ear pain and hearing loss.   Eyes:  Negative for pain and visual disturbance.  Respiratory:  Negative for cough and shortness of breath.   Cardiovascular:  Negative for chest pain and palpitations.  Gastrointestinal:  Negative for abdominal pain and vomiting.  Musculoskeletal:  Positive for neck pain. Negative for arthralgias and back pain.  Skin:  Negative for color change and rash.  Neurological:  Positive for dizziness. Negative for syncope, facial asymmetry, speech difficulty, weakness, light-headedness, numbness and headaches.  All other systems reviewed and are negative.  Physical Exam Updated Vital Signs BP 122/72    Pulse 62    Temp 98.6 F (37 C) (Oral)    Resp 14     Ht 6' (1.829 m)    Wt 96.7 kg    SpO2 100%    BMI 28.91 kg/m   Physical Exam Constitutional:      General: He is not in acute distress. HENT:     Head: Normocephalic and atraumatic.     Right Ear: Tympanic membrane normal.     Left Ear: Tympanic membrane normal.     Ears:     Comments: Trace blood in right ear canal, small abrasion to external canal No hemotympanum, no perforated membrane Eyes:     Conjunctiva/sclera: Conjunctivae normal.     Pupils: Pupils are equal, round, and reactive to light.  Cardiovascular:     Rate and Rhythm: Normal rate and regular rhythm.  Pulmonary:     Effort: Pulmonary effort is normal. No respiratory distress.  Abdominal:     General: There is no distension.     Tenderness: There is no abdominal tenderness.  Musculoskeletal:     Comments: Mild right sided posterior neck tenderness  Skin:    General: Skin is warm and dry.  Neurological:     General: No focal deficit present.     Mental Status: He is alert and oriented to person, place, and time. Mental status is at baseline.     Sensory: No sensory deficit.     Motor: No weakness.     Comments: Patient demonstrating active vertigo symptoms and nystagmus Head impulse shows corrective saccades Unilateral horizontal nystagmus present with rightward gaze Test of skew shows no skew   Psychiatric:        Mood and Affect: Mood normal.        Behavior: Behavior normal.    ED Results / Procedures / Treatments   Labs (all labs ordered are listed, but only abnormal results are displayed) Labs Reviewed  BASIC METABOLIC PANEL - Abnormal; Notable for the following components:      Result Value   Sodium 132 (*)    Glucose, Bld 136 (*)    Calcium 8.7 (*)    All other components within normal limits  CBC  TROPONIN I (HIGH SENSITIVITY)  TROPONIN I (HIGH SENSITIVITY)    EKG EKG Interpretation  Date/Time:  Monday November 21 2021 08:18:47 EST Ventricular Rate:  69 PR Interval:  152 QRS  Duration: 103 QT Interval:  400 QTC Calculation: 429 R Axis:   59 Text Interpretation: Sinus rhythm Confirmed by Octaviano Glow [phone]) on 11/21/2021 8:41:30 AM  Radiology CT Angio Head W or Wo Contrast  Result Date: 11/21/2021 CLINICAL DATA:  Stroke, follow up; Vertebral artery dissection suspected s/p neck injury 3 days ago, right posterior neck pain, now with vertigo -  evaluate for vascular dissection EXAM: CT ANGIOGRAPHY HEAD AND NECK TECHNIQUE: Multidetector CT imaging of the head and neck was performed using the standard protocol during bolus administration of intravenous contrast. Multiplanar CT image reconstructions and MIPs were obtained to evaluate the vascular anatomy. Carotid stenosis measurements (when applicable) are obtained utilizing NASCET criteria, using the distal internal carotid diameter as the denominator. CONTRAST:  54mL OMNIPAQUE IOHEXOL 350 MG/ML SOLN COMPARISON:  None. FINDINGS: CT HEAD FINDINGS Brain: No evidence of acute infarction, hemorrhage, hydrocephalus, extra-axial collection or mass lesion/mass effect. Vascular: See below. Skull: No acute fracture. Sinuses: Mild scattered paranasal sinus mucosal thickening. Other: No acute orbital findings. Review of the MIP images confirms the above findings CTA NECK FINDINGS Aortic arch: Great vessel origins are patent without significant stenosis. Right carotid system: No evidence of dissection, stenosis (50% or greater) or occlusion. Mild atherosclerosis at the carotid bifurcation. Left carotid system: No evidence of dissection, stenosis (50% or greater) or occlusion. Mild atherosclerosis at the carotid bifurcation. Vertebral arteries: Codominant. No evidence of dissection, stenosis (50% or greater) or occlusion. Skeleton: No evidence of acute fracture. Moderate degenerative disease at C6-C7. Other neck: No acute abnormality. Upper chest: Multiple partially imaged 4 mm right upper lobe nodules. Approximately 3 mm and 2 mm left upper  lobe pulmonary nodules. Review of the MIP images confirms the above findings CTA HEAD FINDINGS Anterior circulation: Bilateral intracranial ICAs, MCAs, and ACAs are patent without proximal hemodynamically significant stenosis. No aneurysm identified. Posterior circulation: Bilateral intradural vertebral arteries, basilar artery, and posterior cerebral arteries are patent without proximal hemodynamically significant stenosis. Right fetal type PCA, anatomic variant. No aneurysm identified. Venous sinuses: As permitted by contrast timing, patent. Anatomic variants: Described above. Review of the MIP images confirms the above findings IMPRESSION: 1. No evidence of acute intracranial abnormality. 2. No evidence of large vessel occlusion, proximal hemodynamically significant stenosis, or dissection. 3. Multiple bilateral upper lobe pulmonary nodules, measuring 4 mm or less. No follow-up needed if patient is low-risk (and has no known or suspected primary neoplasm). Non-contrast chest CT can be considered in 12 months if patient is high-risk. This recommendation follows the consensus statement: Guidelines for Management of Incidental Pulmonary Nodules Detected on CT Images: From the Fleischner Society 2017; Radiology 2017; 284:228-243. Electronically Signed   By: Margaretha Sheffield M.D.   On: 11/21/2021 10:02   CT Angio Neck W and/or Wo Contrast  Result Date: 11/21/2021 CLINICAL DATA:  Stroke, follow up; Vertebral artery dissection suspected s/p neck injury 3 days ago, right posterior neck pain, now with vertigo - evaluate for vascular dissection EXAM: CT ANGIOGRAPHY HEAD AND NECK TECHNIQUE: Multidetector CT imaging of the head and neck was performed using the standard protocol during bolus administration of intravenous contrast. Multiplanar CT image reconstructions and MIPs were obtained to evaluate the vascular anatomy. Carotid stenosis measurements (when applicable) are obtained utilizing NASCET criteria, using the  distal internal carotid diameter as the denominator. CONTRAST:  75mL OMNIPAQUE IOHEXOL 350 MG/ML SOLN COMPARISON:  None. FINDINGS: CT HEAD FINDINGS Brain: No evidence of acute infarction, hemorrhage, hydrocephalus, extra-axial collection or mass lesion/mass effect. Vascular: See below. Skull: No acute fracture. Sinuses: Mild scattered paranasal sinus mucosal thickening. Other: No acute orbital findings. Review of the MIP images confirms the above findings CTA NECK FINDINGS Aortic arch: Great vessel origins are patent without significant stenosis. Right carotid system: No evidence of dissection, stenosis (50% or greater) or occlusion. Mild atherosclerosis at the carotid bifurcation. Left carotid system: No evidence of dissection, stenosis (  50% or greater) or occlusion. Mild atherosclerosis at the carotid bifurcation. Vertebral arteries: Codominant. No evidence of dissection, stenosis (50% or greater) or occlusion. Skeleton: No evidence of acute fracture. Moderate degenerative disease at C6-C7. Other neck: No acute abnormality. Upper chest: Multiple partially imaged 4 mm right upper lobe nodules. Approximately 3 mm and 2 mm left upper lobe pulmonary nodules. Review of the MIP images confirms the above findings CTA HEAD FINDINGS Anterior circulation: Bilateral intracranial ICAs, MCAs, and ACAs are patent without proximal hemodynamically significant stenosis. No aneurysm identified. Posterior circulation: Bilateral intradural vertebral arteries, basilar artery, and posterior cerebral arteries are patent without proximal hemodynamically significant stenosis. Right fetal type PCA, anatomic variant. No aneurysm identified. Venous sinuses: As permitted by contrast timing, patent. Anatomic variants: Described above. Review of the MIP images confirms the above findings IMPRESSION: 1. No evidence of acute intracranial abnormality. 2. No evidence of large vessel occlusion, proximal hemodynamically significant stenosis, or  dissection. 3. Multiple bilateral upper lobe pulmonary nodules, measuring 4 mm or less. No follow-up needed if patient is low-risk (and has no known or suspected primary neoplasm). Non-contrast chest CT can be considered in 12 months if patient is high-risk. This recommendation follows the consensus statement: Guidelines for Management of Incidental Pulmonary Nodules Detected on CT Images: From the Fleischner Society 2017; Radiology 2017; 284:228-243. Electronically Signed   By: Margaretha Sheffield M.D.   On: 11/21/2021 10:02    Procedures Procedures   Medications Ordered in ED Medications  meclizine (ANTIVERT) tablet 50 mg (50 mg Oral Given 11/21/21 0818)  diazepam (VALIUM) tablet 5 mg (5 mg Oral Given 11/21/21 0819)  iohexol (OMNIPAQUE) 350 MG/ML injection 75 mL (75 mLs Intravenous Contrast Given 11/21/21 0931)  ondansetron (ZOFRAN) injection 4 mg (4 mg Intravenous Given 11/21/21 0946)    ED Course  I have reviewed the triage vital signs and the nursing notes.  Pertinent labs & imaging results that were available during my care of the patient were reviewed by me and considered in my medical decision making (see chart for details).  Patient is here with vertigo.  Differential diagnosis include peripheral vertigo most likely given his prior history, versus central vertigo.  Will give him meclizine and Antivert.  I strongly suspect is peripheral vertigo, which is supported by his hints exam.  Remainder of his neurological exam is benign does not show any localizing symptoms for stroke  However with his neck injury and pain, I do think a CT angiogram of the head and neck would be reasonable to rule out vascular dissection or injury.  The patient and his wife are in agreement with this.    Clinical Course as of 11/21/21 1717  Mon Nov 21, 2021  0944 Nauseated, zofran ordered [MT]  1011 IMPRESSION: 1. No evidence of acute intracranial abnormality. 2. No evidence of large vessel occlusion,  proximal hemodynamically significant stenosis, or dissection. 3. Multiple bilateral upper lobe pulmonary nodules, measuring 4 mm or less. No follow-up needed if patient is low-risk (and has no known or suspected primary neoplasm). Non-contrast chest CT can be considered in 12 months if patient is high-risk. This recommendation follows the consensus statement: Guidelines for Management of Incidental Pulmonary Nodules Detected on CT Images: From the Fleischner Society 2017; Radiology 2017; 284:228-243. [MT]  Holiday flat [MT]  9924 Both the patient's vertigo and nausea improved.  We will attempt to ambulate him.  If he is steady on his feet, can discharge him with meclizine and some as needed Valium.  He already has an ENT doctor he can follow-up with.  I discussed his work-up with him and his wife at bedside, including the CT results and the incidental pulmonary nodules.  Overall he is low risk for lung cancer, has no significant family history, and is not a smoker.  His doctor can consider repeat CT scan in 12 months as needed. [MT]  12 Able to ambulate to bathroom, okay for discharge [MT]    Clinical Course User Index [MT] Brittania Sudbeck, Carola Rhine, MD    Final Clinical Impression(s) / ED Diagnoses Final diagnoses:  Peripheral vertigo, unspecified laterality  Pulmonary nodule    Rx / DC Orders ED Discharge Orders          Ordered    meclizine (ANTIVERT) 25 MG tablet  3 times daily PRN        11/21/21 1115    ondansetron (ZOFRAN-ODT) 4 MG disintegrating tablet  Every 8 hours PRN        11/21/21 1115    diazepam (VALIUM) 2 MG tablet  Every 12 hours PRN        11/21/21 1115             Wyvonnia Dusky, MD 11/21/21 1718

## 2021-11-21 NOTE — ED Notes (Signed)
EDP notified of pt's symptoms. Pt now reporting that he woke up at 0400 and felt normal. He was driving to work and felt sudden onset of dizziness at 0500. No neuro deficits noted at this time. Nystagmus noted upon exam.

## 2021-11-21 NOTE — Discharge Instructions (Addendum)
Please schedule follow-up appointment with the ear nose and throat doctor for your vertigo.  This may be related to an issue inside your ears.  You can take meclizine as needed when you have attacks of vertigo, and Valium for severe symptoms.  Zofran was also prescribed for nausea.  We talked about the incidental finding of nodules in your lungs and your CT scan.  I recommended that your primary care doctor can repeat an image in 12 months as needed.  The radiologist recommendation is listed below.  Multiple bilateral upper lobe pulmonary nodules, measuring 4 mm or less. No follow-up needed if patient is low-risk (and has no known or suspected primary neoplasm). Non-contrast chest CT can be considered in 12 months if patient is high-risk. This recommendation follows the consensus statement: Guidelines for Management of Incidental Pulmonary Nodules Detected on CT Images: From the Fleischner Society 2017; Radiology 2017; 284:228-243.

## 2021-11-21 NOTE — ED Notes (Signed)
Pt ambulated to bathroom with minimal dizziness.

## 2021-11-21 NOTE — ED Triage Notes (Signed)
Pt presents to ED via POV with c/o dizziness and n/v that started this morning, right ear pain and decreased hearing that started yesterday. Wife reports pt kept saying something was in his right ear and they used a q-tip in his ear and noticed it was bleeding.

## 2022-01-11 ENCOUNTER — Encounter (HOSPITAL_COMMUNITY)
Admission: RE | Disposition: A | Discharge status: Home / Self Care | Payer: Self-pay | Source: Home / Self Care | Attending: Gastroenterology

## 2022-01-11 ENCOUNTER — Ambulatory Visit (HOSPITAL_COMMUNITY): Payer: 59 | Admitting: Anesthesiology

## 2022-01-11 ENCOUNTER — Other Ambulatory Visit: Payer: Self-pay

## 2022-01-11 ENCOUNTER — Encounter (HOSPITAL_COMMUNITY): Payer: Self-pay | Admitting: Gastroenterology

## 2022-01-11 ENCOUNTER — Ambulatory Visit (HOSPITAL_COMMUNITY)
Admission: RE | Admit: 2022-01-11 | Discharge: 2022-01-11 | Disposition: A | Discharge status: Home / Self Care | Payer: 59 | Attending: Gastroenterology | Admitting: Gastroenterology

## 2022-01-11 DIAGNOSIS — K635 Polyp of colon: Secondary | ICD-10-CM | POA: Diagnosis not present

## 2022-01-11 DIAGNOSIS — K5730 Diverticulosis of large intestine without perforation or abscess without bleeding: Secondary | ICD-10-CM

## 2022-01-11 DIAGNOSIS — D120 Benign neoplasm of cecum: Secondary | ICD-10-CM | POA: Diagnosis not present

## 2022-01-11 DIAGNOSIS — D125 Benign neoplasm of sigmoid colon: Secondary | ICD-10-CM | POA: Insufficient documentation

## 2022-01-11 DIAGNOSIS — K648 Other hemorrhoids: Secondary | ICD-10-CM

## 2022-01-11 DIAGNOSIS — Z1211 Encounter for screening for malignant neoplasm of colon: Secondary | ICD-10-CM | POA: Diagnosis not present

## 2022-01-11 DIAGNOSIS — Z800 Family history of malignant neoplasm of digestive organs: Secondary | ICD-10-CM | POA: Diagnosis not present

## 2022-01-11 HISTORY — PX: COLONOSCOPY WITH PROPOFOL: SHX5780

## 2022-01-11 HISTORY — PX: POLYPECTOMY: SHX149

## 2022-01-11 LAB — HM COLONOSCOPY

## 2022-01-11 SURGERY — COLONOSCOPY WITH PROPOFOL
Anesthesia: General

## 2022-01-11 MED ORDER — PROPOFOL 10 MG/ML IV BOLUS
INTRAVENOUS | Status: DC | PRN
  Administered 2022-01-11: 100 mg via INTRAVENOUS

## 2022-01-11 MED ORDER — LACTATED RINGERS IV SOLN: INTRAVENOUS | Status: DC

## 2022-01-11 MED ORDER — LIDOCAINE HCL (CARDIAC) PF 100 MG/5ML IV SOSY
PREFILLED_SYRINGE | INTRAVENOUS | Status: DC | PRN
  Administered 2022-01-11: 50 mg via INTRAVENOUS

## 2022-01-11 MED ORDER — PROPOFOL 500 MG/50ML IV EMUL
INTRAVENOUS | Status: DC | PRN
  Administered 2022-01-11: 150 ug/kg/min via INTRAVENOUS

## 2022-01-11 MED ORDER — LACTATED RINGERS IV SOLN: INTRAVENOUS | Status: DC | PRN

## 2022-01-11 NOTE — Op Note (Signed)
[facility] Patient Name: Samuel Fischer Procedure Date: 01/11/2022 11:40 AM MRN: [id number] Date of Birth: 1966/02/16 Attending MD: Maylon Peppers ,  CSN: [id number] Age: 56 Admit Type: Outpatient Procedure:                Colonoscopy Indications:              Screening for colorectal malignant neoplasm Providers:                Maylon Peppers, Crystal Page, Raphael Gibney,                            Technician Referring MD:              Medicines:                Monitored Anesthesia Care Complications:            No immediate complications. Estimated Blood Loss:     Estimated blood loss: none. Procedure:                Pre-Anesthesia Assessment:                           - Prior to the procedure, a History and Physical                            was performed, and patient medications, allergies                            and sensitivities were reviewed. The patient's                            tolerance of previous anesthesia was reviewed.                           - The risks and benefits of the procedure and the                            sedation options and risks were discussed with the                            patient. All questions were answered and informed                            consent was obtained.                           - ASA Grade Assessment: III - A patient with severe                            systemic disease.                           After obtaining informed consent, the colonoscope                            was passed under direct vision. Throughout the  procedure, the patient's blood pressure, pulse, and                            oxygen saturations were monitored continuously. The                            [id number] ([id number]) was introduced through the                            anus and advanced to the the cecum, identified by                            appendiceal orifice and ileocecal valve. The                             colonoscopy was performed without difficulty. The                            patient tolerated the procedure well. The quality                            of the bowel preparation was good. Scope In: 11:49:50 AM Scope Out: 12:08:30 PM Scope Withdrawal Time: 0 hours 15 minutes 58 seconds  Total Procedure Duration: 0 hours 18 minutes 40 seconds  Findings:      Hemorrhoids were found on perianal exam.      A 4 mm polyp was found in the cecum. The polyp was sessile. The polyp       was removed with a cold snare. Resection and retrieval were complete.      A 8 mm polyp was found in the sigmoid colon. The polyp was semi-sessile.       The polyp was removed with a hot snare. Resection and retrieval were       complete.      A few small-mouthed diverticula were found in the sigmoid colon.      Non-bleeding internal hemorrhoids were found during retroflexion. The       hemorrhoids were small. Impression:               - Hemorrhoids found on perianal exam.                           - One 4 mm polyp in the cecum, removed with a cold                            snare. Resected and retrieved.                           - One 8 mm polyp in the sigmoid colon, removed with                            a hot snare. Resected and retrieved.                           - Diverticulosis in the sigmoid colon.                           -  Non-bleeding internal hemorrhoids. Moderate Sedation:      Per Anesthesia Care Recommendation:           - Discharge patient to home (ambulatory).                           - Resume previous diet.                           - Await pathology results.                           - Repeat colonoscopy for surveillance based on                            pathology results. Procedure Code(s):        --- Professional ---                           [phone], Colonoscopy, flexible; with removal of                            tumor(s), polyp(s), or other lesion(s) by snare                             technique Diagnosis Code(s):        --- Professional ---                           Z12.11, Encounter for screening for malignant                            neoplasm of colon                           K64.8, Other hemorrhoids                           K63.5, Polyp of colon                           K57.30, Diverticulosis of large intestine without                            perforation or abscess without bleeding CPT copyright 2019 American Medical Association. All rights reserved. The codes documented in this report are preliminary and upon coder review may  be revised to meet current compliance requirements. Maylon Peppers, MD Maylon Peppers,  01/11/2022 12:13:00 PM This report has been signed electronically. Number of Addenda: 0

## 2022-01-11 NOTE — Anesthesia Procedure Notes (Signed)
Procedure Name: MAC Date/Time: 01/11/2022 11:46 AM Performed by: Orlie Dakin, CRNA Pre-anesthesia Checklist: Patient identified, Emergency Drugs available, Suction available and Patient being monitored Patient Re-evaluated:Patient Re-evaluated prior to induction Oxygen Delivery Method: Nasal cannula

## 2022-01-11 NOTE — Transfer of Care (Signed)
Immediate Anesthesia Transfer of Care Note  Patient: Samuel Fischer  Procedure(s) Performed: COLONOSCOPY WITH PROPOFOL POLYPECTOMY INTESTINAL  Patient Location: Endoscopy Unit  Anesthesia Type:General  Level of Consciousness: awake  Airway & Oxygen Therapy: Patient Spontanous Breathing  Post-op Assessment: Report given to RN and Post -op Vital signs reviewed and stable  Post vital signs: Reviewed and stable  Last Vitals:  Vitals Value Taken Time  BP    Temp    Pulse    Resp    SpO2      Last Pain:  Vitals:   01/11/22 1147  TempSrc:   PainSc: 0-No pain      Patients Stated Pain Goal: 8 (11/00/34 9611)  Complications: No notable events documented.

## 2022-01-11 NOTE — H&P (Signed)
Samuel Fischer is an 56 y.o. male.   Chief Complaint: screening colonoscopy HPI: 56 y/o M with Pmh vertigo, arthritis and gout, coming for screening colonoscopy. The patient has never had a colonoscopy in the past.  The patient denies having any complaints such as melena, hematochezia, abdominal pain or distention, change in her bowel movement consistency or frequency, no changes in her weight recently.  The patient had an uncle that had colon cancer in his 31s.   Past Medical History:  Diagnosis Date   Arthritis    Gout    Vertigo     Past Surgical History:  Procedure Laterality Date   APPENDECTOMY     right knee arthroscopy      Family History  Problem Relation Age of Onset   Colon cancer Paternal Uncle    Social History:  reports that he has never smoked. He has never used smokeless tobacco. He reports current alcohol use. He reports that he does not use drugs.  Allergies: No Known Allergies  Medications Prior to Admission  Medication Sig Dispense Refill   allopurinol (ZYLOPRIM) 100 MG tablet Take 1 tablet (100 mg total) by mouth daily. 90 tablet 1   cetirizine (ZYRTEC) 10 MG tablet Take 10 mg by mouth daily.     polyethylene glycol-electrolytes (TRILYTE) 420 g solution Take 4,000 mLs by mouth as directed. 4000 mL 0   ondansetron (ZOFRAN-ODT) 4 MG disintegrating tablet Take 1 tablet (4 mg total) by mouth every 8 (eight) hours as needed for up to 15 doses for nausea or vomiting. 15 tablet 0    No results found for this or any previous visit (from the past 48 hour(s)). No results found.  Review of Systems  Constitutional: Negative.   HENT: Negative.    Eyes: Negative.   Respiratory: Negative.    Cardiovascular: Negative.   Gastrointestinal: Negative.   Endocrine: Negative.   Genitourinary: Negative.   Musculoskeletal: Negative.   Skin: Negative.   Allergic/Immunologic: Negative.   Neurological: Negative.   Hematological: Negative.   Psychiatric/Behavioral: Negative.      Blood pressure (!) 129/92, pulse 71, temperature 97.7 F (36.5 C), temperature source Oral, resp. rate (!) 22, height 6' (1.829 m), weight 90.7 kg, SpO2 96 %. Physical Exam  GENERAL: The patient is AO x3, in no acute distress. HEENT: Head is normocephalic and atraumatic. EOMI are intact. Mouth is well hydrated and without lesions. NECK: Supple. No masses LUNGS: Clear to auscultation. No presence of rhonchi/wheezing/rales. Adequate chest expansion HEART: RRR, normal s1 and s2. ABDOMEN: Soft, nontender, no guarding, no peritoneal signs, and nondistended. BS +. No masses. EXTREMITIES: Without any cyanosis, clubbing, rash, lesions or edema. NEUROLOGIC: AOx3, no focal motor deficit. SKIN: no jaundice, no rashes  Assessment/Plan 56 y/o M with Pmh vertigo, arthritis and gout, coming for screening colonoscopy. The patient is at average risk for colorectal cancer.  We will proceed with colonoscopy today.   Harvel Quale, MD 01/11/2022, 10:00 AM

## 2022-01-11 NOTE — Anesthesia Preprocedure Evaluation (Signed)

## 2022-01-11 NOTE — Discharge Instructions (Signed)
You are being discharged to home.  Resume your previous diet.  We are waiting for your pathology results.  Your physician has recommended a repeat colonoscopy for surveillance based on pathology results.  

## 2022-01-12 ENCOUNTER — Encounter (INDEPENDENT_AMBULATORY_CARE_PROVIDER_SITE_OTHER): Payer: Self-pay | Admitting: *Deleted

## 2022-01-12 LAB — SURGICAL PATHOLOGY

## 2022-01-12 NOTE — Anesthesia Postprocedure Evaluation (Signed)
Anesthesia Post Note  Patient: Samuel Fischer  Procedure(s) Performed: COLONOSCOPY WITH PROPOFOL POLYPECTOMY INTESTINAL  Patient location during evaluation: Phase II Anesthesia Type: General Level of consciousness: awake Pain management: pain level controlled Vital Signs Assessment: post-procedure vital signs reviewed and stable Respiratory status: spontaneous breathing and respiratory function stable Cardiovascular status: blood pressure returned to baseline and stable Postop Assessment: no headache and no apparent nausea or vomiting Anesthetic complications: no Comments: Late entry   No notable events documented.   Last Vitals:  Vitals:   01/11/22 1212 01/11/22 1216  BP: (!) 86/50 108/76  Pulse: 88 68  Resp: 12 16  Temp: (!) 36.4 C   SpO2: 97% 97%    Last Pain:  Vitals:   01/11/22 1216  TempSrc:   PainSc: 0-No pain                 Louann Sjogren

## 2022-01-13 ENCOUNTER — Encounter (HOSPITAL_COMMUNITY): Payer: Self-pay | Admitting: Gastroenterology

## 2022-04-20 ENCOUNTER — Ambulatory Visit: Payer: 59 | Admitting: Internal Medicine

## 2022-05-04 ENCOUNTER — Ambulatory Visit (INDEPENDENT_AMBULATORY_CARE_PROVIDER_SITE_OTHER): Payer: 59 | Admitting: Internal Medicine

## 2022-05-04 ENCOUNTER — Encounter: Payer: Self-pay | Admitting: Internal Medicine

## 2022-05-04 VITALS — BP 138/82 | HR 56 | Resp 18 | Ht 72.0 in | Wt 208.2 lb

## 2022-05-04 DIAGNOSIS — E782 Mixed hyperlipidemia: Secondary | ICD-10-CM | POA: Insufficient documentation

## 2022-05-04 DIAGNOSIS — Z0000 Encounter for general adult medical examination without abnormal findings: Secondary | ICD-10-CM | POA: Diagnosis not present

## 2022-05-04 DIAGNOSIS — M1710 Unilateral primary osteoarthritis, unspecified knee: Secondary | ICD-10-CM

## 2022-05-04 DIAGNOSIS — M1A9XX0 Chronic gout, unspecified, without tophus (tophi): Secondary | ICD-10-CM | POA: Diagnosis not present

## 2022-05-04 MED ORDER — NAPROXEN 500 MG PO TABS: 500.0000 mg | ORAL_TABLET | Freq: Two times a day (BID) | ORAL | 5 refills | Status: DC

## 2022-05-04 MED ORDER — ALLOPURINOL 100 MG PO TABS: 100.0000 mg | ORAL_TABLET | Freq: Every day | ORAL | 3 refills | Status: DC

## 2022-05-04 NOTE — Patient Instructions (Signed)
Please take Naproxen and Tylenol alternatively for knee pain.  Please continue to take Allopurinol for gout.  Please get fasting blood tests done before the next visit.

## 2022-05-04 NOTE — Assessment & Plan Note (Signed)
Chronic gout Had about 3 gout flares last year Refilled Allopurinol Check uric acid level 

## 2022-05-04 NOTE — Progress Notes (Signed)
Established Patient Office Visit  Subjective:  Patient ID: Samuel Fischer, male    DOB: 06-24-1966  Age: 56 y.o. MRN: [id number]  CC:  Chief Complaint  Patient presents with   Follow-up    Follow up     HPI Samuel Fischer is a 56 y.o. male with past medical history of gout who presents for f/u of his chronic medical conditions.  He has been doing well overall. No episodes of gout since starting Allopurinol.   He c/o chronic right knee pain. He has had arthroscopy in the past, but does not have any Orthopedic surgeon to follow up currently. He has been taking Tylenol for pain with mild relief. 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    Past Medical History:  Diagnosis Date   Arthritis    Gout    Vertigo     Past Surgical History:  Procedure Laterality Date   APPENDECTOMY     COLONOSCOPY WITH PROPOFOL N/A 01/11/2022   Procedure: COLONOSCOPY WITH PROPOFOL;  Surgeon: Harvel Quale, MD;  Location: [facility];  Service: Gastroenterology;  Laterality: N/A;  9:00   POLYPECTOMY  01/11/2022   Procedure: POLYPECTOMY INTESTINAL;  Surgeon: Montez Morita, Quillian Quince, MD;  Location: [facility];  Service: Gastroenterology;;   right knee arthroscopy      Family History  Problem Relation Age of Onset   Colon cancer Paternal Uncle     Social History   Socioeconomic History   Marital status: Married    Spouse name: Not on file   Number of children: Not on file   Years of education: Not on file   Highest education level: Not on file  Occupational History   Not on file  Tobacco Use   Smoking status: Never   Smokeless tobacco: Never  Vaping Use   Vaping Use: Never used  Substance and Sexual Activity   Alcohol use: Yes    Comment: occasionally   Drug use: Never   Sexual activity: Not on file  Other Topics Concern   Not on file  Social History Narrative   Not on file   Social Determinants of Health   Financial Resource Strain: Not on file   Food Insecurity: Not on file  Transportation Needs: Not on file  Physical Activity: Not on file  Stress: Not on file  Social Connections: Not on file  Intimate Partner Violence: Not on file    Outpatient Medications Prior to Visit  Medication Sig Dispense Refill   cetirizine (ZYRTEC) 10 MG tablet Take 10 mg by mouth daily.     ondansetron (ZOFRAN-ODT) 4 MG disintegrating tablet Take 1 tablet (4 mg total) by mouth every 8 (eight) hours as needed for up to 15 doses for nausea or vomiting. 15 tablet 0   allopurinol (ZYLOPRIM) 100 MG tablet Take 1 tablet (100 mg total) by mouth daily. 90 tablet 1   No facility-administered medications prior to visit.    No Known Allergies  ROS Review of Systems  Constitutional:  Negative for chills and fever.  HENT:  Negative for congestion and sore throat.   Eyes:  Negative for pain and discharge.  Respiratory:  Negative for cough and shortness of breath.   Cardiovascular:  Negative for chest pain and palpitations.  Gastrointestinal:  Negative for constipation, diarrhea, nausea and vomiting.  Endocrine: Negative for polydipsia and polyuria.  Genitourinary:  Negative for dysuria and hematuria.  Musculoskeletal:  Positive for arthralgias (R knee). Negative for neck  pain and neck stiffness.  Skin:  Negative for rash.  Neurological:  Negative for dizziness, weakness, numbness and headaches.  Psychiatric/Behavioral:  Negative for agitation and behavioral problems.      Objective:    Physical Exam Vitals reviewed.  Constitutional:      General: He is not in acute distress.    Appearance: He is not diaphoretic.  HENT:     Head: Normocephalic and atraumatic.     Nose: Nose normal.     Mouth/Throat:     Mouth: Mucous membranes are moist.  Eyes:     General: No scleral icterus.    Extraocular Movements: Extraocular movements intact.  Cardiovascular:     Rate and Rhythm: Normal rate and regular rhythm.     Pulses: Normal pulses.     Heart  sounds: Normal heart sounds. No murmur heard. Pulmonary:     Breath sounds: Normal breath sounds. No wheezing or rales.  Abdominal:     Palpations: Abdomen is soft.     Tenderness: There is no abdominal tenderness.  Musculoskeletal:        General: Tenderness (Medial aspect of right knee with mild swelling, no erythema) present.     Cervical back: Neck supple. No tenderness.     Right lower leg: No edema.     Left lower leg: No edema.  Skin:    General: Skin is warm.     Findings: No rash.  Neurological:     General: No focal deficit present.     Mental Status: He is alert and oriented to person, place, and time.     Cranial Nerves: No cranial nerve deficit.     Sensory: No sensory deficit.     Motor: No weakness.  Psychiatric:        Mood and Affect: Mood normal.        Behavior: Behavior normal.    BP 138/82 (BP Location: Right Arm, Patient Position: Sitting, Cuff Size: Normal)   Pulse (!) 56   Resp 18   Ht 6' (1.829 m)   Wt 208 lb 3.2 oz (94.4 kg)   SpO2 98%   BMI 28.24 kg/m  Wt Readings from Last 3 Encounters:  05/04/22 208 lb 3.2 oz (94.4 kg)  01/11/22 200 lb (90.7 kg)  11/21/21 213 lb 3 oz (96.7 kg)    No results found for: TSH Lab Results  Component Value Date   WBC 7.1 11/21/2021   HGB 14.4 11/21/2021   HCT 42.5 11/21/2021   MCV 93.6 11/21/2021   PLT 220 11/21/2021   Lab Results  Component Value Date   NA 132 (L) 11/21/2021   K 4.1 11/21/2021   CO2 24 11/21/2021   GLUCOSE 136 (H) 11/21/2021   BUN 16 11/21/2021   CREATININE 0.85 11/21/2021   BILITOT 0.3 10/20/2021   ALKPHOS 99 10/20/2021   AST 24 10/20/2021   ALT 26 10/20/2021   PROT 6.8 10/20/2021   ALBUMIN 4.3 10/20/2021   CALCIUM 8.7 (L) 11/21/2021   ANIONGAP 10 11/21/2021   EGFR 83 10/20/2021   Lab Results  Component Value Date   CHOL 195 10/20/2021   Lab Results  Component Value Date   HDL 47 10/20/2021   Lab Results  Component Value Date   LDLCALC 108 (H) 10/20/2021   Lab  Results  Component Value Date   TRIG 233 (H) 10/20/2021   Lab Results  Component Value Date   CHOLHDL 4.1 10/20/2021   Lab Results  Component Value  Date   HGBA1C 5.3 10/20/2021      Assessment & Plan:   Problem List Items Addressed This Visit       Musculoskeletal and Integument   Arthritis of knee    Right knee pain with swelling Has had arthroscopy in the past Could be ligament injury in addition to arthritis, offered Orthopedic surgery referral, he prefers to wait for now. Tylenol arthritis PRN Naproxen as needed for mild-to-moderate pain       Relevant Medications   allopurinol (ZYLOPRIM) 100 MG tablet   naproxen (NAPROSYN) 500 MG tablet     Other   Gout - Primary    Chronic gout Had about 3 gout flares last year Refilled Allopurinol Check uric acid level       Relevant Medications   allopurinol (ZYLOPRIM) 100 MG tablet   Other Relevant Orders   Uric acid   Mixed hyperlipidemia    Lipid profile reviewed Advised to follow low cholesterol diet for now       Relevant Orders   Lipid panel     Meds ordered this encounter  Medications   allopurinol (ZYLOPRIM) 100 MG tablet    Sig: Take 1 tablet (100 mg total) by mouth daily.    Dispense:  90 tablet    Refill:  3   naproxen (NAPROSYN) 500 MG tablet    Sig: Take 1 tablet (500 mg total) by mouth 2 (two) times daily with a meal.    Dispense:  30 tablet    Refill:  5    Follow-up: Return in about 6 months (around 11/03/2022) for Annual physical.    Lindell Spar, MD

## 2022-05-04 NOTE — Assessment & Plan Note (Signed)
Right knee pain with swelling Has had arthroscopy in the past Could be ligament injury in addition to arthritis, offered Orthopedic surgery referral, he prefers to wait for now. Tylenol arthritis PRN Naproxen as needed for mild-to-moderate pain

## 2022-05-04 NOTE — Assessment & Plan Note (Signed)
Lipid profile reviewed Advised to follow low cholesterol diet for now

## 2022-07-06 ENCOUNTER — Telehealth: Payer: Self-pay

## 2022-07-06 DIAGNOSIS — Z0279 Encounter for issue of other medical certificate: Secondary | ICD-10-CM

## 2022-07-06 NOTE — Telephone Encounter (Signed)
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  Copied Noted sleeved

## 2022-07-11 NOTE — Telephone Encounter (Signed)
Called patient forms are ready will pick up

## 2022-08-17 ENCOUNTER — Ambulatory Visit (INDEPENDENT_AMBULATORY_CARE_PROVIDER_SITE_OTHER): Payer: 59

## 2022-08-17 ENCOUNTER — Ambulatory Visit (INDEPENDENT_AMBULATORY_CARE_PROVIDER_SITE_OTHER): Payer: 59 | Admitting: Podiatry

## 2022-08-17 ENCOUNTER — Encounter: Payer: Self-pay | Admitting: Podiatry

## 2022-08-17 DIAGNOSIS — M722 Plantar fascial fibromatosis: Secondary | ICD-10-CM | POA: Diagnosis not present

## 2022-08-17 MED ORDER — TRIAMCINOLONE ACETONIDE 10 MG/ML IJ SUSP
10.0000 mg | Freq: Once | INTRAMUSCULAR | Status: AC
  Administered 2022-08-17: 10 mg

## 2022-08-17 MED ORDER — DICLOFENAC SODIUM 75 MG PO TBEC: 75.0000 mg | DELAYED_RELEASE_TABLET | Freq: Two times a day (BID) | ORAL | 2 refills | Status: DC

## 2022-08-17 NOTE — Patient Instructions (Signed)

## 2022-08-17 NOTE — Progress Notes (Signed)
Subjective:   Patient ID: Samuel Fischer, male   DOB: 56 y.o.   MRN: [id number]   HPI Patient states he is developed a lot of pain in the bottom of the right heel over the last couple weeks and had history of this in his left heel several years ago.  States it is very tender when he tries to walk or be active   ROS      Objective:  Physical Exam  Neurovascular status found to be intact muscle strength adequate with patient noted to have severe discomfort in the plantar aspect of the right heel at the insertion of tendon into the calcaneus.  Moderate depression of arch noted     Assessment:  Acute fasciitis right with inflammation fluid in the medial band     Plan:  H&P reviewed condition and went ahead today did sterile prep injected the medial band right fascia 3 mg Kenalog 5 mg Xylocaine applied fascial brace to lift up the arch with instructions on support and patient will be seen back to recheck  X-rays indicate small spur no indication stress fracture arthritis

## 2022-11-09 ENCOUNTER — Ambulatory Visit (INDEPENDENT_AMBULATORY_CARE_PROVIDER_SITE_OTHER): Payer: 59 | Admitting: Internal Medicine

## 2022-11-09 ENCOUNTER — Encounter: Payer: Self-pay | Admitting: Internal Medicine

## 2022-11-09 VITALS — BP 113/76 | HR 62 | Ht 72.0 in | Wt 204.0 lb

## 2022-11-09 DIAGNOSIS — E782 Mixed hyperlipidemia: Secondary | ICD-10-CM

## 2022-11-09 DIAGNOSIS — Z0001 Encounter for general adult medical examination with abnormal findings: Secondary | ICD-10-CM | POA: Diagnosis not present

## 2022-11-09 DIAGNOSIS — R739 Hyperglycemia, unspecified: Secondary | ICD-10-CM

## 2022-11-09 DIAGNOSIS — M1710 Unilateral primary osteoarthritis, unspecified knee: Secondary | ICD-10-CM

## 2022-11-09 DIAGNOSIS — M1A9XX0 Chronic gout, unspecified, without tophus (tophi): Secondary | ICD-10-CM

## 2022-11-09 DIAGNOSIS — Z2821 Immunization not carried out because of patient refusal: Secondary | ICD-10-CM

## 2022-11-09 NOTE — Patient Instructions (Addendum)
Please do not take HCTZ as it can worsen gout.  Please continue taking other medications as prescribed.  Please continue to follow low salt diet and perform moderate exercise/walking at least 150 mins/week.

## 2022-11-09 NOTE — Assessment & Plan Note (Signed)
Right knee pain with swelling Has had arthroscopy in the past Could be ligament injury in addition to arthritis, offered Orthopedic surgery referral, he prefers to wait for now. Tylenol arthritis PRN Naproxen or diclofenac did not help

## 2022-11-09 NOTE — Assessment & Plan Note (Signed)
Lipid profile reviewed Advised to follow low cholesterol diet for now

## 2022-11-09 NOTE — Progress Notes (Signed)
Established Patient Office Visit  Subjective:  Patient ID: Samuel Fischer, male    DOB: 1966-07-25  Age: 56 y.o. MRN: [id number]  CC:  Chief Complaint  Patient presents with   Annual Exam    HPI Samuel Fischer is a 56 y.o. male with past medical history of gout who presents for annual physical.  He has been doing well overall. He has had only 1 episode of gout since starting Allopurinol.  Of note, chart review suggests that he takes HCTZ PRN for vertigo.  He agrees to stop taking HCTZ due to his history of gout.  He c/o chronic right knee pain. He has had arthroscopy in the past, but does not have any Orthopedic surgeon to follow up currently. He has been taking Tylenol for pain with mild relief. 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    Past Medical History:  Diagnosis Date   Arthritis    Gout    Vertigo     Past Surgical History:  Procedure Laterality Date   APPENDECTOMY     COLONOSCOPY WITH PROPOFOL N/A 01/11/2022   Procedure: COLONOSCOPY WITH PROPOFOL;  Surgeon: Harvel Quale, MD;  Location: [facility];  Service: Gastroenterology;  Laterality: N/A;  9:00   POLYPECTOMY  01/11/2022   Procedure: POLYPECTOMY INTESTINAL;  Surgeon: Montez Morita, Quillian Quince, MD;  Location: [facility];  Service: Gastroenterology;;   right knee arthroscopy      Family History  Problem Relation Age of Onset   Colon cancer Paternal Uncle     Social History   Socioeconomic History   Marital status: Married    Spouse name: Not on file   Number of children: Not on file   Years of education: Not on file   Highest education level: Not on file  Occupational History   Not on file  Tobacco Use   Smoking status: Never   Smokeless tobacco: Never  Vaping Use   Vaping Use: Never used  Substance and Sexual Activity   Alcohol use: Yes    Comment: occasionally   Drug use: Never   Sexual activity: Not on file  Other Topics Concern   Not on file  Social History  Narrative   Not on file   Social Determinants of Health   Financial Resource Strain: Not on file  Food Insecurity: Not on file  Transportation Needs: Not on file  Physical Activity: Not on file  Stress: Not on file  Social Connections: Not on file  Intimate Partner Violence: Not on file    Outpatient Medications Prior to Visit  Medication Sig Dispense Refill   allopurinol (ZYLOPRIM) 100 MG tablet Take 1 tablet (100 mg total) by mouth daily. 90 tablet 3   cetirizine (ZYRTEC) 10 MG tablet Take 10 mg by mouth daily.     colchicine 0.6 MG tablet Take 1 tablet by mouth 2 (two) times daily.     diclofenac (VOLTAREN) 75 MG EC tablet Take 1 tablet (75 mg total) by mouth 2 (two) times daily. (Patient not taking: Reported on 11/09/2022) 50 tablet 2   NASACORT ALLERGY 24HR 55 MCG/ACT AERO nasal inhaler 2 sprays daily. (Patient not taking: Reported on 11/09/2022)     ondansetron (ZOFRAN-ODT) 4 MG disintegrating tablet Take 1 tablet (4 mg total) by mouth every 8 (eight) hours as needed for up to 15 doses for nausea or vomiting. (Patient not taking: Reported on 11/09/2022) 15 tablet 0   hydrochlorothiazide (HYDRODIURIL) 12.5 MG tablet Take  12.5 mg by mouth daily. (Patient not taking: Reported on 11/09/2022)     naproxen (NAPROSYN) 500 MG tablet Take 1 tablet (500 mg total) by mouth 2 (two) times daily with a meal. (Patient not taking: Reported on 11/09/2022) 30 tablet 5   No facility-administered medications prior to visit.    No Known Allergies  ROS Review of Systems  Constitutional:  Negative for chills and fever.  HENT:  Negative for congestion and sore throat.   Eyes:  Negative for pain and discharge.  Respiratory:  Negative for cough and shortness of breath.   Cardiovascular:  Negative for chest pain and palpitations.  Gastrointestinal:  Negative for constipation, diarrhea, nausea and vomiting.  Endocrine: Negative for polydipsia and polyuria.  Genitourinary:  Negative for dysuria and  hematuria.  Musculoskeletal:  Positive for arthralgias (R knee). Negative for neck pain and neck stiffness.  Skin:  Negative for rash.  Neurological:  Negative for dizziness, weakness, numbness and headaches.  Psychiatric/Behavioral:  Negative for agitation and behavioral problems.       Objective:    Physical Exam Vitals reviewed.  Constitutional:      General: He is not in acute distress.    Appearance: He is not diaphoretic.  HENT:     Head: Normocephalic and atraumatic.     Nose: Nose normal.     Mouth/Throat:     Mouth: Mucous membranes are moist.  Eyes:     General: No scleral icterus.    Extraocular Movements: Extraocular movements intact.  Cardiovascular:     Rate and Rhythm: Normal rate and regular rhythm.     Pulses: Normal pulses.     Heart sounds: Normal heart sounds. No murmur heard. Pulmonary:     Breath sounds: Normal breath sounds. No wheezing or rales.  Abdominal:     Palpations: Abdomen is soft.     Tenderness: There is no abdominal tenderness.  Musculoskeletal:        General: Tenderness (Medial aspect of right knee with mild swelling, no erythema) present.     Cervical back: Neck supple. No tenderness.     Right lower leg: No edema.     Left lower leg: No edema.  Skin:    General: Skin is warm.     Findings: No rash.  Neurological:     General: No focal deficit present.     Mental Status: He is alert and oriented to person, place, and time.     Cranial Nerves: No cranial nerve deficit.     Sensory: No sensory deficit.     Motor: No weakness.  Psychiatric:        Mood and Affect: Mood normal.        Behavior: Behavior normal.     BP 113/76 (BP Location: Right Arm, Patient Position: Sitting, Cuff Size: Large)   Pulse 62   Ht 6' (1.829 m)   Wt 204 lb (92.5 kg)   SpO2 96%   BMI 27.67 kg/m  Wt Readings from Last 3 Encounters:  11/09/22 204 lb (92.5 kg)  05/04/22 208 lb 3.2 oz (94.4 kg)  01/11/22 200 lb (90.7 kg)    No results found for:  "TSH" Lab Results  Component Value Date   WBC 7.1 11/21/2021   HGB 14.4 11/21/2021   HCT 42.5 11/21/2021   MCV 93.6 11/21/2021   PLT 220 11/21/2021   Lab Results  Component Value Date   NA 132 (L) 11/21/2021   K 4.1 11/21/2021   CO2  24 11/21/2021   GLUCOSE 136 (H) 11/21/2021   BUN 16 11/21/2021   CREATININE 0.85 11/21/2021   BILITOT 0.3 10/20/2021   ALKPHOS 99 10/20/2021   AST 24 10/20/2021   ALT 26 10/20/2021   PROT 6.8 10/20/2021   ALBUMIN 4.3 10/20/2021   CALCIUM 8.7 (L) 11/21/2021   ANIONGAP 10 11/21/2021   EGFR 83 10/20/2021   Lab Results  Component Value Date   CHOL 195 10/20/2021   Lab Results  Component Value Date   HDL 47 10/20/2021   Lab Results  Component Value Date   LDLCALC 108 (H) 10/20/2021   Lab Results  Component Value Date   TRIG 233 (H) 10/20/2021   Lab Results  Component Value Date   CHOLHDL 4.1 10/20/2021   Lab Results  Component Value Date   HGBA1C 5.3 10/20/2021      Assessment & Plan:   Problem List Items Addressed This Visit       Musculoskeletal and Integument   Arthritis of knee    Right knee pain with swelling Has had arthroscopy in the past Could be ligament injury in addition to arthritis, offered Orthopedic surgery referral, he prefers to wait for now. Tylenol arthritis PRN Naproxen or diclofenac did not help        Other   Gout    Chronic gout Had about 3 gout flares last year Refilled Allopurinol Check uric acid level      Relevant Orders   CMP14+EGFR   CBC with Differential/Platelet   Uric acid   Encounter for general adult medical examination with abnormal findings - Primary    Physical exam as documented. Counseling done  re healthy lifestyle involving commitment to 150 minutes exercise per week, heart healthy diet, and attaining healthy weight.The importance of adequate sleep also discussed. Changes in health habits are decided on by the patient with goals and time frames  set for achieving  them. Immunization and cancer screening needs are specifically addressed at this visit.  Denies flu and Shingrix vaccine.      Mixed hyperlipidemia    Lipid profile reviewed Advised to follow low cholesterol diet for now      Relevant Orders   Lipid panel   CMP14+EGFR   Other Visit Diagnoses     Hyperglycemia       Relevant Orders   Hemoglobin A1c   Refused tetanus toxoid       Refused influenza vaccine           No orders of the defined types were placed in this encounter.   Follow-up: Return in about 1 year (around 11/10/2023) for Annual physical.    Lindell Spar, MD

## 2022-11-09 NOTE — Assessment & Plan Note (Signed)

## 2022-11-09 NOTE — Assessment & Plan Note (Signed)
Chronic gout Had about 3 gout flares last year Refilled Allopurinol Check uric acid level

## 2022-11-10 ENCOUNTER — Other Ambulatory Visit: Payer: Self-pay | Admitting: Internal Medicine

## 2022-11-10 DIAGNOSIS — M1A9XX0 Chronic gout, unspecified, without tophus (tophi): Secondary | ICD-10-CM

## 2022-11-10 LAB — CMP14+EGFR
ALT: 23 IU/L (ref 0–44)
AST: 24 IU/L (ref 0–40)
Albumin/Globulin Ratio: 1.6 (ref 1.2–2.2)
Albumin: 4.4 g/dL (ref 3.8–4.9)
Alkaline Phosphatase: 77 IU/L (ref 44–121)
BUN/Creatinine Ratio: 12 (ref 9–20)
BUN: 11 mg/dL (ref 6–24)
Bilirubin Total: 0.7 mg/dL (ref 0.0–1.2)
CO2: 23 mmol/L (ref 20–29)
Calcium: 9.3 mg/dL (ref 8.7–10.2)
Chloride: 99 mmol/L (ref 96–106)
Creatinine, Ser: 0.95 mg/dL (ref 0.76–1.27)
Globulin, Total: 2.8 g/dL (ref 1.5–4.5)
Glucose: 95 mg/dL (ref 70–99)
Potassium: 4.9 mmol/L (ref 3.5–5.2)
Sodium: 137 mmol/L (ref 134–144)
Total Protein: 7.2 g/dL (ref 6.0–8.5)
eGFR: 95 mL/min/{1.73_m2} (ref >59)

## 2022-11-10 LAB — CBC WITH DIFFERENTIAL/PLATELET
Basophils Absolute: 0 10*3/uL (ref 0.0–0.2)
Basos: 1 %
EOS (ABSOLUTE): 0.1 10*3/uL (ref 0.0–0.4)
Eos: 2 %
Hematocrit: 44.8 % (ref 37.5–51.0)
Hemoglobin: 15.2 g/dL (ref 13.0–17.7)
Immature Grans (Abs): 0 10*3/uL (ref 0.0–0.1)
Immature Granulocytes: 0 %
Lymphocytes Absolute: 2.1 10*3/uL (ref 0.7–3.1)
Lymphs: 31 %
MCH: 31.1 pg (ref 26.6–33.0)
MCHC: 33.9 g/dL (ref 31.5–35.7)
MCV: 92 fL (ref 79–97)
Monocytes Absolute: 0.7 10*3/uL (ref 0.1–0.9)
Monocytes: 11 %
Neutrophils Absolute: 3.7 10*3/uL (ref 1.4–7.0)
Neutrophils: 55 %
Platelets: 281 10*3/uL (ref 150–450)
RBC: 4.89 x10E6/uL (ref 4.14–5.80)
RDW: 12.1 % (ref 11.6–15.4)
WBC: 6.6 10*3/uL (ref 3.4–10.8)

## 2022-11-10 LAB — LIPID PANEL
Chol/HDL Ratio: 3.6 ratio (ref 0.0–5.0)
Cholesterol, Total: 196 mg/dL (ref 100–199)
HDL: 55 mg/dL (ref >39)
LDL Chol Calc (NIH): 126 mg/dL — ABNORMAL HIGH (ref 0–99)
Triglycerides: 85 mg/dL (ref 0–149)
VLDL Cholesterol Cal: 15 mg/dL (ref 5–40)

## 2022-11-10 LAB — HEMOGLOBIN A1C
Est. average glucose Bld gHb Est-mCnc: 108 mg/dL
Hgb A1c MFr Bld: 5.4 % (ref 4.8–5.6)

## 2022-11-10 LAB — URIC ACID: Uric Acid: 6 mg/dL (ref 3.8–8.4)

## 2022-11-10 MED ORDER — ALLOPURINOL 100 MG PO TABS: 100.0000 mg | ORAL_TABLET | Freq: Two times a day (BID) | ORAL | 3 refills | Status: DC

## 2023-11-15 ENCOUNTER — Encounter: Payer: 59 | Admitting: Internal Medicine

## 2023-11-26 IMAGING — CT CT ANGIO NECK
1 of 13 series · 4 of 33 positions shown · IV contrast (omnipaque)
Comparison: None.

CLINICAL DATA: Stroke, follow up; Vertebral artery dissection
suspected s/p neck injury 3 days ago, right posterior neck pain, now
with vertigo - evaluate for vascular dissection

EXAM:
CT ANGIOGRAPHY HEAD AND NECK
TECHNIQUE: Multidetector CT imaging of the head and neck was performed using
the standard protocol during bolus administration of intravenous
contrast. Multiplanar CT image reconstructions and MIPs were
obtained to evaluate the vascular anatomy. Carotid stenosis
measurements (when applicable) are obtained utilizing NASCET
criteria, using the distal internal carotid diameter as the
denominator.
CONTRAST:  75mL OMNIPAQUE IOHEXOL 350 MG/ML SOLN

[Series 8: cta head & neck · axial · 0.39mm/px · z∈[+45,+250]mm · 4 of 685 slices shown]
[im 137/685  soft-tissue]
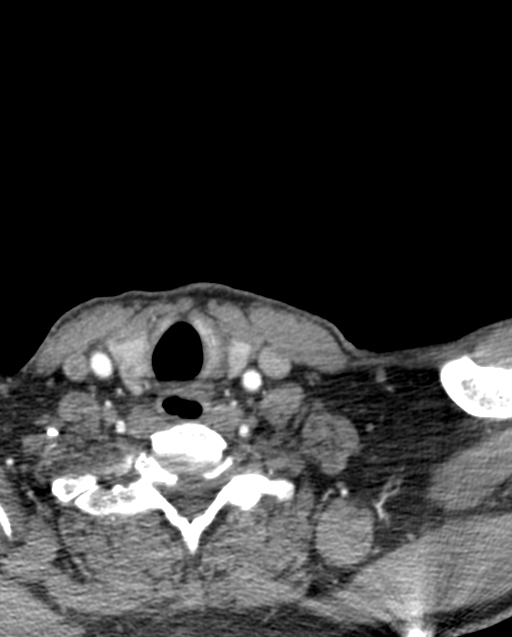
[im 274/685  bone]
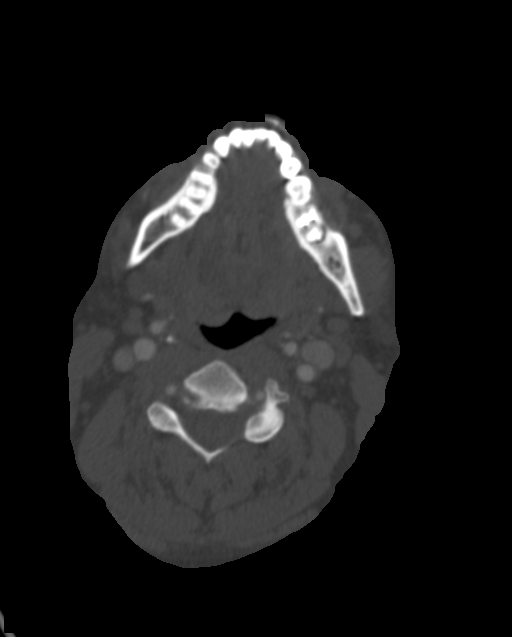
[im 411/685  soft-tissue]
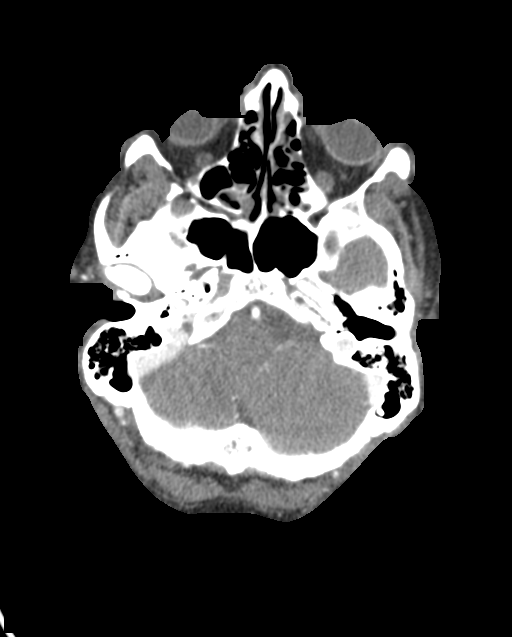
[im 548/685  bone]
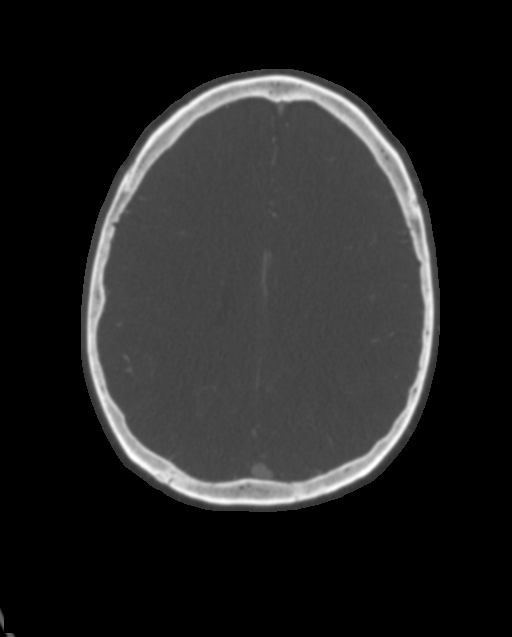

[4 of 33 positions shown; findings below may reference images not displayed]

FINDINGS: CT HEAD FINDINGS

Brain: No evidence of acute infarction, hemorrhage, hydrocephalus,
extra-axial collection or mass lesion/mass effect.

Vascular: See below.

Skull: No acute fracture.

Sinuses: Mild scattered paranasal sinus mucosal thickening.

Other: No acute orbital findings.

Review of the MIP images confirms the above findings

CTA NECK FINDINGS

Aortic arch: Great vessel origins are patent without significant
stenosis.

Right carotid system: No evidence of dissection, stenosis (50% or
greater) or occlusion. Mild atherosclerosis at the carotid
bifurcation.

Left carotid system: No evidence of dissection, stenosis (50% or
greater) or occlusion. Mild atherosclerosis at the carotid
bifurcation.

Vertebral arteries: Codominant. No evidence of dissection, stenosis
(50% or greater) or occlusion.

Skeleton: No evidence of acute fracture. Moderate degenerative
disease at C6-C7.

Other neck: No acute abnormality.

Upper chest: Multiple partially imaged 4 mm right upper lobe
nodules. Approximately 3 mm and 2 mm left upper lobe pulmonary
nodules.

Review of the MIP images confirms the above findings

CTA HEAD FINDINGS

Anterior circulation: Bilateral intracranial ICAs, MCAs, and ACAs
are patent without proximal hemodynamically significant stenosis. No
aneurysm identified.

Posterior circulation: Bilateral intradural vertebral arteries,
basilar artery, and posterior cerebral arteries are patent without
proximal hemodynamically significant stenosis. Right fetal type PCA,
anatomic variant. No aneurysm identified.

Venous sinuses: As permitted by contrast timing, patent.

Anatomic variants: Described above.

Review of the MIP images confirms the above findings
IMPRESSION: 1. No evidence of acute intracranial abnormality.
2. No evidence of large vessel occlusion, proximal hemodynamically
significant stenosis, or dissection.
3. Multiple bilateral upper lobe pulmonary nodules, measuring 4 mm
or less. No follow-up needed if patient is low-risk (and has no
known or suspected primary neoplasm). Non-contrast chest CT can be
considered in 12 months if patient is high-risk. This recommendation
follows the consensus statement: Guidelines for Management of
Incidental Pulmonary Nodules Detected on CT Images: From the

## 2023-11-26 IMAGING — CT CT ANGIO HEAD
2 of 10 series · 9 of 33 positions shown · IV contrast (Omnipaque or Isovue)
Comparison: None.

CLINICAL DATA: Stroke, follow up; Vertebral artery dissection
suspected s/p neck injury 3 days ago, right posterior neck pain, now
with vertigo - evaluate for vascular dissection

EXAM:
CT ANGIOGRAPHY HEAD AND NECK
TECHNIQUE: Multidetector CT imaging of the head and neck was performed using
the standard protocol during bolus administration of intravenous
contrast. Multiplanar CT image reconstructions and MIPs were
obtained to evaluate the vascular anatomy. Carotid stenosis
measurements (when applicable) are obtained utilizing NASCET
criteria, using the distal internal carotid diameter as the
denominator.
CONTRAST:  75mL OMNIPAQUE IOHEXOL 350 MG/ML SOLN

[Series 7: cta head & neck · axial · 0.47mm/px · z∈[-24,+318]mm · 3 of 172 slices shown]
[im 1/172  soft-tissue]
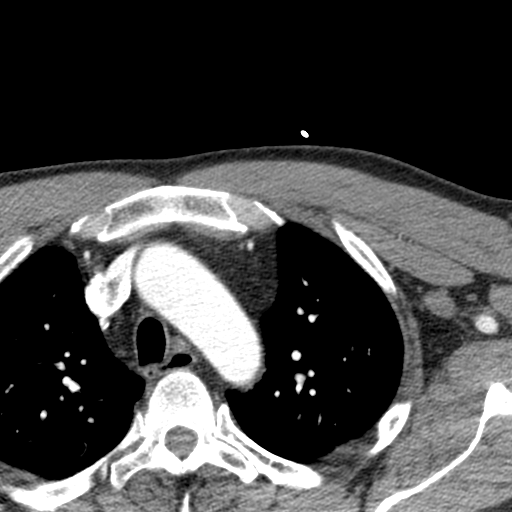
[im 86/172  bone]
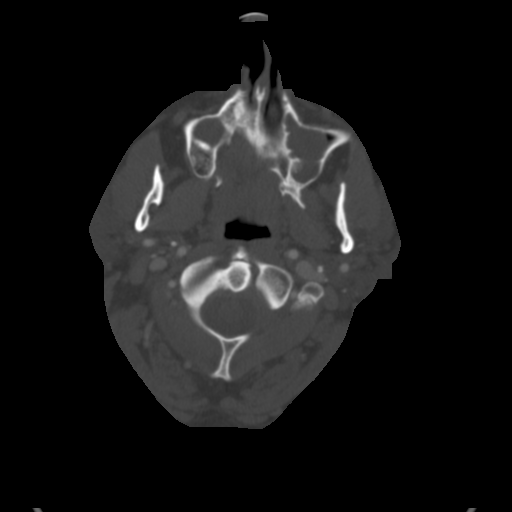
[im 172/172  soft-tissue]
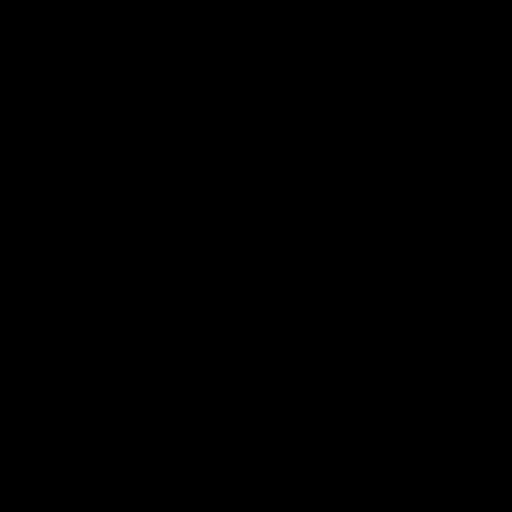

[Series 9: ax thins · axial · 0.39mm/px · z∈[+25,+268]mm · 6 of 341 slices shown]
[im 49/341  soft-tissue]
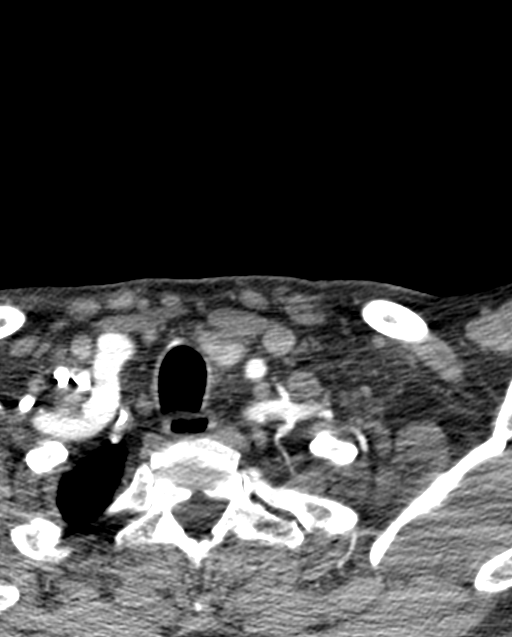
[im 98/341  soft-tissue]
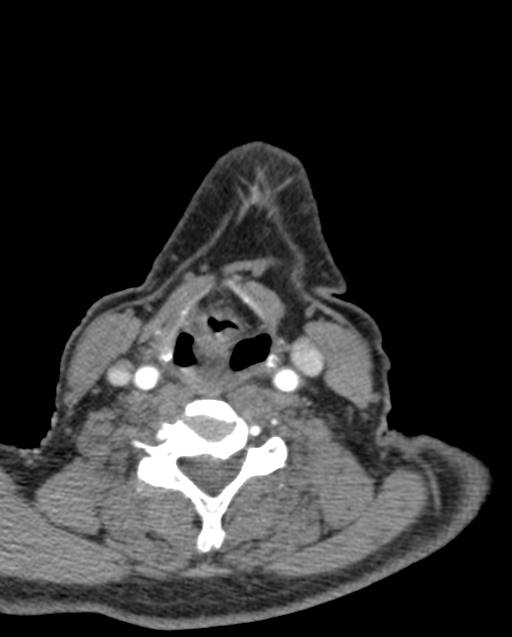
[im 146/341  soft-tissue]
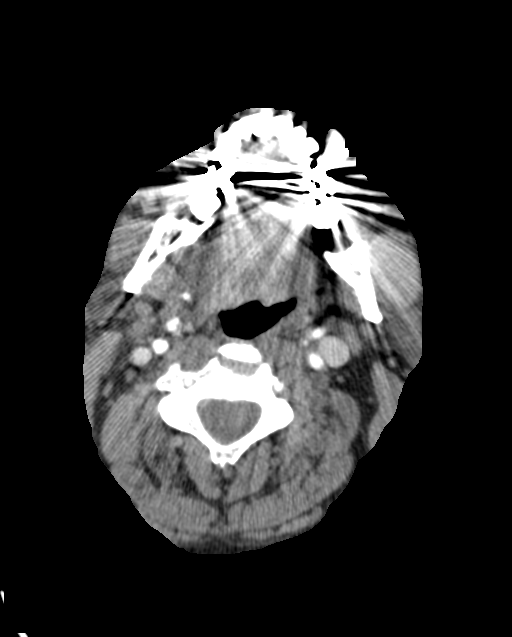
[im 195/341  soft-tissue]
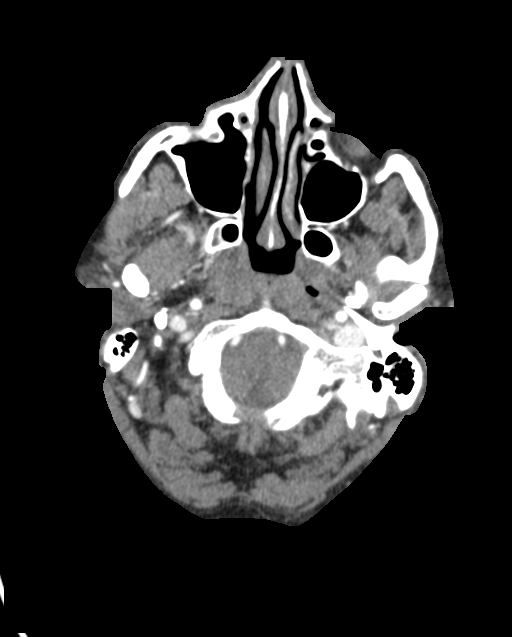
[im 243/341  soft-tissue]
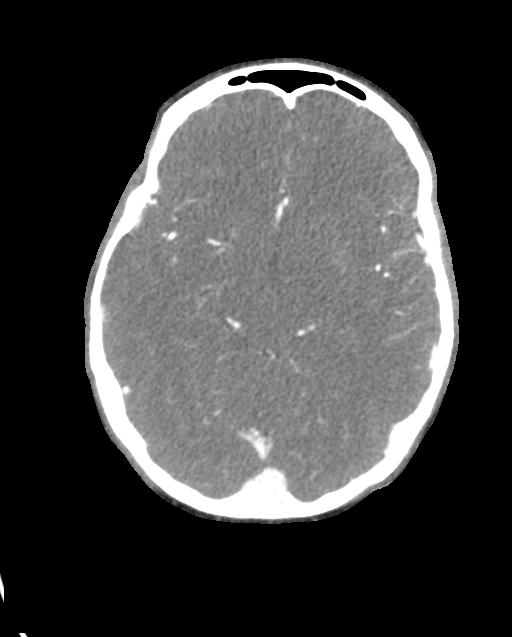
[im 292/341  soft-tissue]
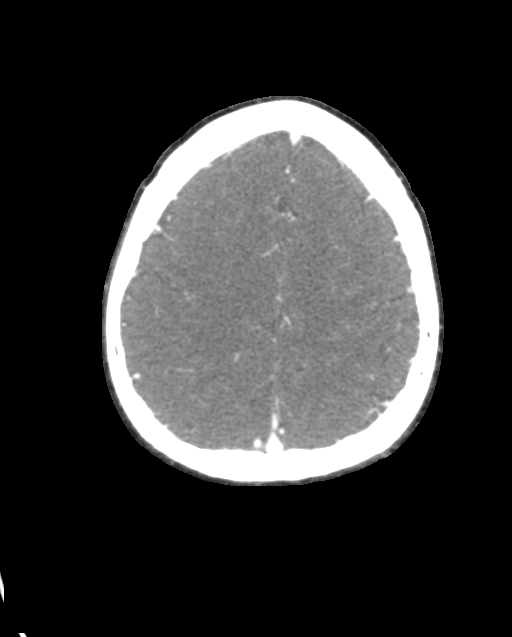

[9 of 33 positions shown; findings below may reference images not displayed]

FINDINGS: CT HEAD FINDINGS

Brain: No evidence of acute infarction, hemorrhage, hydrocephalus,
extra-axial collection or mass lesion/mass effect.

Vascular: See below.

Skull: No acute fracture.

Sinuses: Mild scattered paranasal sinus mucosal thickening.

Other: No acute orbital findings.

Review of the MIP images confirms the above findings

CTA NECK FINDINGS

Aortic arch: Great vessel origins are patent without significant
stenosis.

Right carotid system: No evidence of dissection, stenosis (50% or
greater) or occlusion. Mild atherosclerosis at the carotid
bifurcation.

Left carotid system: No evidence of dissection, stenosis (50% or
greater) or occlusion. Mild atherosclerosis at the carotid
bifurcation.

Vertebral arteries: Codominant. No evidence of dissection, stenosis
(50% or greater) or occlusion.

Skeleton: No evidence of acute fracture. Moderate degenerative
disease at C6-C7.

Other neck: No acute abnormality.

Upper chest: Multiple partially imaged 4 mm right upper lobe
nodules. Approximately 3 mm and 2 mm left upper lobe pulmonary
nodules.

Review of the MIP images confirms the above findings

CTA HEAD FINDINGS

Anterior circulation: Bilateral intracranial ICAs, MCAs, and ACAs
are patent without proximal hemodynamically significant stenosis. No
aneurysm identified.

Posterior circulation: Bilateral intradural vertebral arteries,
basilar artery, and posterior cerebral arteries are patent without
proximal hemodynamically significant stenosis. Right fetal type PCA,
anatomic variant. No aneurysm identified.

Venous sinuses: As permitted by contrast timing, patent.

Anatomic variants: Described above.

Review of the MIP images confirms the above findings
IMPRESSION: 1. No evidence of acute intracranial abnormality.
2. No evidence of large vessel occlusion, proximal hemodynamically
significant stenosis, or dissection.
3. Multiple bilateral upper lobe pulmonary nodules, measuring 4 mm
or less. No follow-up needed if patient is low-risk (and has no
known or suspected primary neoplasm). Non-contrast chest CT can be
considered in 12 months if patient is high-risk. This recommendation
follows the consensus statement: Guidelines for Management of
Incidental Pulmonary Nodules Detected on CT Images: From the

## 2024-04-02 ENCOUNTER — Other Ambulatory Visit: Payer: Self-pay | Admitting: Internal Medicine

## 2024-04-02 DIAGNOSIS — M1A9XX0 Chronic gout, unspecified, without tophus (tophi): Secondary | ICD-10-CM

## 2024-04-03 ENCOUNTER — Ambulatory Visit: Payer: Self-pay | Admitting: Family Medicine

## 2024-04-04 ENCOUNTER — Ambulatory Visit (INDEPENDENT_AMBULATORY_CARE_PROVIDER_SITE_OTHER)

## 2024-04-04 VITALS — BP 148/89 | HR 73 | Ht 72.0 in | Wt 205.1 lb

## 2024-04-04 DIAGNOSIS — M1A9XX0 Chronic gout, unspecified, without tophus (tophi): Secondary | ICD-10-CM | POA: Diagnosis not present

## 2024-04-04 DIAGNOSIS — H8113 Benign paroxysmal vertigo, bilateral: Secondary | ICD-10-CM | POA: Diagnosis not present

## 2024-04-04 MED ORDER — MECLIZINE HCL 25 MG PO TABS: 25.0000 mg | ORAL_TABLET | Freq: Three times a day (TID) | ORAL | 5 refills | Status: AC | PRN

## 2024-04-04 MED ORDER — ONDANSETRON 4 MG PO TBDP: 4.0000 mg | ORAL_TABLET | Freq: Three times a day (TID) | ORAL | 5 refills | Status: AC | PRN

## 2024-04-04 MED ORDER — COLCHICINE 0.6 MG PO TABS: 0.6000 mg | ORAL_TABLET | Freq: Every day | ORAL | 2 refills | Status: AC | PRN

## 2024-04-04 NOTE — Progress Notes (Unsigned)
   Acute Office Visit  Subjective:     Patient ID: Samuel Fischer, male    DOB: May 07, 1966, 58 y.o.   MRN: [id number]  Chief Complaint  Patient presents with   Medical Management of Chronic Issues    Pt here states "having vertigo and needs medication refills"    HPI Patient is in today for vertigo and refills of meclizine   Review of Systems  Constitutional: Negative.   HENT:  Positive for tinnitus.   Eyes: Negative.   Respiratory: Negative.    Cardiovascular: Negative.   Gastrointestinal: Negative.   Genitourinary: Negative.   Musculoskeletal: Negative.   Skin: Negative.   Neurological:  Positive for dizziness.  Psychiatric/Behavioral: Negative.          Objective:    BP (!) 148/89   Pulse 73   Ht 6' (1.829 m)   Wt 205 lb 1.9 oz (93 kg)   SpO2 96%   BMI 27.82 kg/m    Physical Exam Vitals and nursing note reviewed.  Constitutional:      Appearance: Normal appearance.  HENT:     Head: Normocephalic.     Right Ear: Tympanic membrane, ear canal and external ear normal.     Left Ear: Tympanic membrane, ear canal and external ear normal.     Nose: Nose normal.     Mouth/Throat:     Mouth: Mucous membranes are moist.     Pharynx: Oropharynx is clear.  Cardiovascular:     Rate and Rhythm: Normal rate and regular rhythm.  Pulmonary:     Effort: Pulmonary effort is normal.     Breath sounds: Normal breath sounds.  Musculoskeletal:     Cervical back: Normal range of motion and neck supple.  Skin:    General: Skin is warm and dry.  Neurological:     Mental Status: He is alert and oriented to person, place, and time.     Coordination: Coordination is intact.     Gait: Gait is intact.  Psychiatric:        Mood and Affect: Mood normal.        Thought Content: Thought content normal.     No results found for any visits on 04/04/24.      Assessment & Plan:   Problem List Items Addressed This Visit       Nervous and Auditory   BPV (benign positional  vertigo), bilateral   Relevant Medications   ondansetron  (ZOFRAN -ODT) 4 MG disintegrating tablet   meclizine  (ANTIVERT ) 25 MG tablet     Other   Gout - Primary   Relevant Medications   colchicine  0.6 MG tablet    Meds ordered this encounter  Medications   ondansetron  (ZOFRAN -ODT) 4 MG disintegrating tablet    Sig: Take 1 tablet (4 mg total) by mouth every 8 (eight) hours as needed for up to 90 doses for nausea or vomiting.    Dispense:  20 tablet    Refill:  5   colchicine  0.6 MG tablet    Sig: Take 1 tablet (0.6 mg total) by mouth daily as needed (gout attack).    Dispense:  30 tablet    Refill:  2   meclizine  (ANTIVERT ) 25 MG tablet    Sig: Take 1 tablet (25 mg total) by mouth 3 (three) times daily as needed for dizziness.    Dispense:  60 tablet    Refill:  5    No follow-ups on file.  Alison Irvine, FNP

## 2024-04-04 NOTE — Patient Instructions (Addendum)
 Look up vestibular exercises to do at home, such as Epley maneuver or Dix-Hallpike.

## 2024-04-06 DIAGNOSIS — H8113 Benign paroxysmal vertigo, bilateral: Secondary | ICD-10-CM | POA: Insufficient documentation

## 2024-04-23 ENCOUNTER — Ambulatory Visit: Payer: Self-pay | Admitting: Family Medicine

## 2025-02-24 ENCOUNTER — Ambulatory Visit: Admitting: Physician Assistant

## 2025-04-07 ENCOUNTER — Encounter: Admitting: Internal Medicine
